# Patient Record
Sex: Male | Born: 1975 | Race: Black or African American | Hispanic: No | Marital: Single | Smoking: Current every day smoker
Health system: Southern US, Community
[De-identification: ages and names within clinical notes are randomized; demographics above are authoritative.]

---

## 2012-01-10 ENCOUNTER — Encounter (HOSPITAL_COMMUNITY): Payer: Self-pay | Admitting: Emergency Medicine

## 2012-01-10 ENCOUNTER — Emergency Department (HOSPITAL_COMMUNITY): Payer: Self-pay

## 2012-01-10 ENCOUNTER — Emergency Department (HOSPITAL_COMMUNITY)
Admission: EM | Admit: 2012-01-10 | Discharge: 2012-01-10 | Disposition: A | Discharge status: Home / Self Care | Payer: Self-pay | Attending: Emergency Medicine | Admitting: Emergency Medicine

## 2012-01-10 DIAGNOSIS — R109 Unspecified abdominal pain: Secondary | ICD-10-CM | POA: Insufficient documentation

## 2012-01-10 DIAGNOSIS — F17200 Nicotine dependence, unspecified, uncomplicated: Secondary | ICD-10-CM | POA: Insufficient documentation

## 2012-01-10 LAB — BASIC METABOLIC PANEL
BUN: 11 mg/dL (ref 6–23)
CO2: 27 mEq/L (ref 19–32)
Calcium: 9.4 mg/dL (ref 8.4–10.5)
Chloride: 104 mEq/L (ref 96–112)
Creatinine, Ser: 1.01 mg/dL (ref 0.50–1.35)
GFR calc Af Amer: >90 mL/min (ref >90)
GFR calc non Af Amer: >90 mL/min (ref >90)
Glucose, Bld: 113 mg/dL — ABNORMAL HIGH (ref 70–99)
Potassium: 4.4 mEq/L (ref 3.5–5.1)
Sodium: 138 mEq/L (ref 135–145)

## 2012-01-10 LAB — CBC WITH DIFFERENTIAL/PLATELET
Basophils Absolute: 0 10*3/uL (ref 0.0–0.1)
Basophils Relative: 0 % (ref 0–1)
Eosinophils Absolute: 0 10*3/uL (ref 0.0–0.7)
Eosinophils Relative: 0 % (ref 0–5)
HCT: 42.4 % (ref 39.0–52.0)
Hemoglobin: 14.8 g/dL (ref 13.0–17.0)
Lymphocytes Relative: 21 % (ref 12–46)
Lymphs Abs: 1.9 10*3/uL (ref 0.7–4.0)
MCH: 32.1 pg (ref 26.0–34.0)
MCHC: 34.9 g/dL (ref 30.0–36.0)
MCV: 92 fL (ref 78.0–100.0)
Monocytes Absolute: 0.7 10*3/uL (ref 0.1–1.0)
Monocytes Relative: 7 % (ref 3–12)
Neutro Abs: 6.4 10*3/uL (ref 1.7–7.7)
Neutrophils Relative %: 72 % (ref 43–77)
Platelets: 253 10*3/uL (ref 150–400)
RBC: 4.61 MIL/uL (ref 4.22–5.81)
RDW: 13.7 % (ref 11.5–15.5)
WBC: 9 10*3/uL (ref 4.0–10.5)

## 2012-01-10 LAB — URINALYSIS, ROUTINE W REFLEX MICROSCOPIC
Bilirubin Urine: NEGATIVE
Glucose, UA: NEGATIVE mg/dL
Hgb urine dipstick: NEGATIVE
Ketones, ur: NEGATIVE mg/dL
Leukocytes, UA: NEGATIVE
Nitrite: NEGATIVE
Protein, ur: NEGATIVE mg/dL
Specific Gravity, Urine: 1.015 (ref 1.005–1.030)
Urobilinogen, UA: 0.2 mg/dL (ref 0.0–1.0)
pH: 7 (ref 5.0–8.0)

## 2012-01-10 LAB — LACTIC ACID, PLASMA: Lactic Acid, Venous: 0.6 mmol/L (ref 0.5–2.2)

## 2012-01-10 MED ORDER — IOHEXOL 300 MG/ML  SOLN
100.0000 mL | Freq: Once | INTRAMUSCULAR | Status: AC | PRN
  Administered 2012-01-10: 100 mL via INTRAVENOUS

## 2012-01-10 MED ORDER — HYDROMORPHONE HCL PF 1 MG/ML IJ SOLN
1.0000 mg | Freq: Once | INTRAMUSCULAR | Status: AC
  Administered 2012-01-10: 1 mg via INTRAVENOUS
  Filled 2012-01-10: qty 1

## 2012-01-10 MED ORDER — SODIUM CHLORIDE 0.9 % IV SOLN
Freq: Once | INTRAVENOUS | Status: AC
  Administered 2012-01-10: 10:00:00 via INTRAVENOUS

## 2012-01-10 MED ORDER — HYDROCODONE-ACETAMINOPHEN 5-325 MG PO TABS: 1.0000 | ORAL_TABLET | Freq: Four times a day (QID) | ORAL | Status: DC | PRN

## 2012-01-10 MED ORDER — SODIUM CHLORIDE 0.9 % IV BOLUS (SEPSIS)
1000.0000 mL | Freq: Once | INTRAVENOUS | Status: AC
  Administered 2012-01-10: 1000 mL via INTRAVENOUS

## 2012-01-10 NOTE — ED Notes (Signed)
Pt states he woke up at 0200 with cramping pain to lower abdomen. Denies N/V/D. PT states last normal BM was yesterday morning.

## 2012-01-10 NOTE — ED Notes (Signed)
Pt complaining of severe lower abd pain since waking him up at 2am. Denies others symptoms.
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
## 2012-01-11 ENCOUNTER — Emergency Department (HOSPITAL_COMMUNITY)
Admission: EM | Admit: 2012-01-11 | Discharge: 2012-01-11 | Disposition: A | Discharge status: Home / Self Care | Payer: Self-pay | Attending: Emergency Medicine | Admitting: Emergency Medicine

## 2012-01-11 ENCOUNTER — Encounter (HOSPITAL_COMMUNITY): Payer: Self-pay | Admitting: *Deleted

## 2012-01-11 ENCOUNTER — Encounter (HOSPITAL_COMMUNITY): Payer: Self-pay | Admitting: Emergency Medicine

## 2012-01-11 DIAGNOSIS — R109 Unspecified abdominal pain: Secondary | ICD-10-CM

## 2012-01-11 DIAGNOSIS — F17200 Nicotine dependence, unspecified, uncomplicated: Secondary | ICD-10-CM | POA: Insufficient documentation

## 2012-01-11 LAB — CBC WITH DIFFERENTIAL/PLATELET
Basophils Absolute: 0 10*3/uL (ref 0.0–0.1)
Basophils Relative: 0 % (ref 0–1)
Eosinophils Absolute: 0 10*3/uL (ref 0.0–0.7)
Eosinophils Relative: 0 % (ref 0–5)
HCT: 42.2 % (ref 39.0–52.0)
Hemoglobin: 14.7 g/dL (ref 13.0–17.0)
Lymphocytes Relative: 19 % (ref 12–46)
Lymphs Abs: 2.1 10*3/uL (ref 0.7–4.0)
MCH: 32.7 pg (ref 26.0–34.0)
MCHC: 34.8 g/dL (ref 30.0–36.0)
MCV: 94 fL (ref 78.0–100.0)
Monocytes Absolute: 0.9 10*3/uL (ref 0.1–1.0)
Monocytes Relative: 8 % (ref 3–12)
Neutro Abs: 7.8 10*3/uL — ABNORMAL HIGH (ref 1.7–7.7)
Neutrophils Relative %: 72 % (ref 43–77)
Platelets: 254 10*3/uL (ref 150–400)
RBC: 4.49 MIL/uL (ref 4.22–5.81)
RDW: 13.8 % (ref 11.5–15.5)
WBC: 10.8 10*3/uL — ABNORMAL HIGH (ref 4.0–10.5)

## 2012-01-11 MED ORDER — HYOSCYAMINE SULFATE 0.125 MG SL SUBL: 0.1250 mg | SUBLINGUAL_TABLET | SUBLINGUAL | Status: DC | PRN

## 2012-01-11 MED ORDER — ONDANSETRON HCL 4 MG/2ML IJ SOLN: 4.0000 mg | Freq: Once | INTRAMUSCULAR | Status: DC

## 2012-01-11 MED ORDER — SODIUM CHLORIDE 0.9 % IV SOLN
Freq: Once | INTRAVENOUS | Status: AC
  Administered 2012-01-11: 1000 mL via INTRAVENOUS

## 2012-01-11 MED ORDER — CIPROFLOXACIN HCL 500 MG PO TABS: 500.0000 mg | ORAL_TABLET | Freq: Two times a day (BID) | ORAL | Status: DC

## 2012-01-11 MED ORDER — ONDANSETRON HCL 4 MG/2ML IJ SOLN
4.0000 mg | Freq: Once | INTRAMUSCULAR | Status: AC
  Administered 2012-01-11: 4 mg via INTRAVENOUS
  Filled 2012-01-11: qty 2

## 2012-01-11 MED ORDER — ONDANSETRON HCL 4 MG PO TABS: 4.0000 mg | ORAL_TABLET | Freq: Four times a day (QID) | ORAL | Status: DC

## 2012-01-11 MED ORDER — HYDROMORPHONE HCL PF 1 MG/ML IJ SOLN
1.0000 mg | Freq: Once | INTRAMUSCULAR | Status: AC
  Administered 2012-01-11: 1 mg via INTRAVENOUS
  Filled 2012-01-11: qty 1

## 2012-01-11 NOTE — ED Notes (Signed)
Instructions, prescriptions and f/u information reviewed - verbalizes understanding. Alert, in no distress.   

## 2012-01-11 NOTE — ED Provider Notes (Signed)
History     CSN: [id number]  Arrival date & time 01/10/12  2352   First MD Initiated Contact with Patient 01/11/12 0133      Chief Complaint  Patient presents with  . Abdominal Pain    (Consider location/radiation/quality/duration/timing/severity/associated sxs/prior treatment) HPI Joseph Young is a 36 y.o. male who presents to the Emergency Department complaining of recurrent lower abdominal pain that began tonight after eating his evening meal. He was seen and evaluated in the ER earlier today for abdominal pain that had been present for >6 hours. BMET was normal. CT scan of abdomen was unremarkable. He denies fever, chills, vomiting, diarrhea. Last BM was normal. Pain is across the lower abdomen.There are no aggravating or alleviating measures.   History reviewed. No pertinent past medical history.  History reviewed. No pertinent past surgical history.  No family history on file.  History  Substance Use Topics  . Smoking status: Current Every Day Smoker    Types: Cigarettes  . Smokeless tobacco: Not on file  . Alcohol Use: No      Review of Systems  Constitutional: Negative for fever.       10 Systems reviewed and are negative for acute change except as noted in the HPI.  HENT: Negative for congestion.   Eyes: Negative for discharge and redness.  Respiratory: Negative for cough and shortness of breath.   Cardiovascular: Negative for chest pain.  Gastrointestinal: Positive for abdominal pain. Negative for vomiting.  Musculoskeletal: Negative for back pain.  Skin: Negative for rash.  Neurological: Negative for syncope, numbness and headaches.  Psychiatric/Behavioral:       No behavior change.    Allergies  Review of patient's allergies indicates no known allergies.  Home Medications   Current Outpatient Rx  Name Route Sig Dispense Refill  . CIPROFLOXACIN HCL 500 MG PO TABS Oral Take 1 tablet (500 mg total) by mouth 2 (two) times daily. 14 tablet 0  .  HYDROCODONE-ACETAMINOPHEN 5-325 MG PO TABS Oral Take 1 tablet by mouth every 6 (six) hours as needed for pain. 10 tablet 0  . ONDANSETRON HCL 4 MG PO TABS Oral Take 1 tablet (4 mg total) by mouth every 6 (six) hours. 12 tablet 0    BP 144/81  Pulse 57  Temp 98.1 F (36.7 C) (Oral)  Resp 18  Ht 6\' 1"  (1.854 m)  Wt 170 lb (77.111 kg)  BMI 22.43 kg/m2  SpO2 100%  Physical Exam  Nursing note and vitals reviewed. Constitutional: He appears well-developed and well-nourished.       Awake, alert, nontoxic appearance.  HENT:  Head: Atraumatic.  Eyes: Right eye exhibits no discharge. Left eye exhibits no discharge.  Neck: Neck supple.  Cardiovascular: Normal heart sounds.   Pulmonary/Chest: Effort normal and breath sounds normal. He exhibits no tenderness.  Abdominal: Soft. Bowel sounds are normal. He exhibits no mass. There is tenderness. There is no rebound and no guarding.       Mild tenderness to suprapubic area with palpation  Genitourinary:       No cva tenderness to percussion  Musculoskeletal: He exhibits no tenderness.       Baseline ROM, no obvious new focal weakness.  Neurological:       Mental status and motor strength appears baseline for patient and situation.  Skin: No rash noted.  Psychiatric: He has a normal mood and affect.    ED Course  Procedures (including critical care time) Results for orders placed during the hospital  encounter of 01/11/12  CBC WITH DIFFERENTIAL      Component Value Range   WBC 10.8 (*) 4.0 - 10.5 K/uL   RBC 4.49  4.22 - 5.81 MIL/uL   Hemoglobin 14.7  13.0 - 17.0 g/dL   HCT 40.9  81.1 - 91.4 %   MCV 94.0  78.0 - 100.0 fL   MCH 32.7  26.0 - 34.0 pg   MCHC 34.8  30.0 - 36.0 g/dL   RDW 78.2  95.6 - 21.3 %   Platelets 254  150 - 400 K/uL   Neutrophils Relative 72  43 - 77 %   Neutro Abs 7.8 (*) 1.7 - 7.7 K/uL   Lymphocytes Relative 19  12 - 46 %   Lymphs Abs 2.1  0.7 - 4.0 K/uL   Monocytes Relative 8  3 - 12 %   Monocytes Absolute 0.9   0.1 - 1.0 K/uL   Eosinophils Relative 0  0 - 5 %   Eosinophils Absolute 0.0  0.0 - 0.7 K/uL   Basophils Relative 0  0 - 1 %   Basophils Absolute 0.0  0.0 - 0.1 K/uL    Ct Abdomen Pelvis Wo Contrast  01/10/2012  *RADIOLOGY REPORT*  Clinical Data: Abdominal pain, suprapubic pain  CT ABDOMEN AND PELVIS WITHOUT CONTRAST  Technique:  Multidetector CT imaging of the abdomen and pelvis was performed following the standard protocol without intravenous contrast.  Comparison: None.  Findings: Sagittal images of the spine are unremarkable.  Lung bases are unremarkable.  Unenhanced liver is unremarkable.  No calcified gallstones are noted within gallbladder.  Unenhanced pancreas, spleen and adrenals are unremarkable.  Unenhanced kidneys are symmetrical in size.  No nephrolithiasis.  No calcified ureteral calculi are noted. No hydronephrosis or hydroureter.  No aortic aneurysm.  No small bowel obstruction.  No ascites or free air.  Moderate stool in proximal colon.  No pericecal inflammation.  Normal appendix is clearly visualized in axial image 56.  The urinary bladder is under distended.  No calcified calculi are noted within urinary bladder.  Prostate gland is unremarkable.  No pelvic ascites or adenopathy.  No inguinal adenopathy.  No destructive bony lesions are noted within pelvis.  IMPRESSION:  1.  No nephrolithiasis.  No hydronephrosis or hydroureter. 2.  No calcified ureteral calculi are noted. 3.  Normal appendix.   Original Report Authenticated By: Natasha Mead, M.D.    Ct Abdomen Pelvis W Contrast  01/10/2012  *RADIOLOGY REPORT*  Clinical Data: Persistent left lower quadrant pain  CT ABDOMEN AND PELVIS WITH CONTRAST  Technique:  Multidetector CT imaging of the abdomen and pelvis was performed following the standard protocol during bolus administration of intravenous contrast.  Contrast: OMNIPAQUE IOHEXOL 300 MG/ML  SOLN  Comparison: Prior unenhanced CT scan same date.  Findings: Sagittal images of the  spine are unremarkable.  Lung bases are unremarkable.  Oral contrast material is noted within stomach.  Liver, spleen, pancreas and adrenals are unremarkable. Tiny umbilical hernia containing fat without evidence of acute complication.  No aortic aneurysm.  Kidneys are symmetrical in size and enhancement.  Small accessory splenule is noted in the left upper abdomen measures 1.2 cm.  Kidneys are symmetrical in size and enhancement.  No focal renal mass.  No hydronephrosis or hydroureter.  No small bowel obstruction.  No ascites or free air.  No adenopathy.  Limited evaluation of the colon which is not opacified with contrast.  The sigmoid colon is collapsed.  Some gas noted  in distal sigmoid colon.  Some gas noted within rectum.  No pericecal inflammation.  Normal appendix is clearly visualized.  The urinary bladder is unremarkable.  Prostate gland seminal vesicles are unremarkable.  No destructive bony lesions are noted within pelvis.  IMPRESSION:  1.  No hydronephrosis or hydroureter. 2.  No small bowel obstruction. 3.  No pericecal inflammation.  Normal appendix. 4.  Limited assessment of the colon which is not opacified with contrast.  The sigmoid colon is empty collapsed.  Some gas noted in the distal sigmoid colon.  Some gas noted within rectum.   Original Report Authenticated By: Natasha Mead, M.D.      1. Abdominal pain       MDM  Patient with recurrent lower abdominal pain. Work up done earlier in the day included CT abdomen with/w/o contrast  UAand BMET which were unremarkable. Given IVF and analgesics. CBC unremarkable. Initiated antibiotic therapy for a possible early colitis.  Dx testing d/w pt and family.  Questions answered.  Verb understanding, agreeable to d/c home with outpt f/u. Pt feels improved after observation and/or treatment in ED.Pt stable in ED with no significant deterioration in condition.The patient appears reasonably screened and/or stabilized for discharge and I doubt any other  medical condition or other [facility] requiring further screening, evaluation, or treatment in the ED at this time prior to discharge.  MDM Reviewed: nursing note, vitals and previous chart Reviewed previous: labs and CT scan Interpretation: labs          Nicoletta Dress. Colon Branch, MD 01/11/12 1610

## 2012-01-11 NOTE — ED Notes (Signed)
Discharge instructions reviewed with pt, questions answered. Pt verbalized understanding.  

## 2012-01-11 NOTE — ED Notes (Signed)
Ongoing abdominal pain since last visit yesterday. Denies vomiting.

## 2012-01-11 NOTE — ED Notes (Signed)
Patient seen in ER for same earlier.  C/o lower abdominal pain; denies nausea, vomiting or diarrhea.

## 2012-01-11 NOTE — ED Provider Notes (Signed)
History   This chart was scribed for Benny Lennert, MD, by Frederik Pear. The patient was seen in room APA01/APA01 and the patient's care was started at 1619.    CSN: [id number]  Arrival date & time 01/11/12  1619   First MD Initiated Contact with Patient 01/11/12 1704      Chief Complaint  Patient presents with  . Abdominal Pain    (Consider location/radiation/quality/duration/timing/severity/associated sxs/prior treatment) HPI Comments: Joseph Young is a 36 y.o. male who presents to the Emergency Department complaining of moderate, constant abdominal pain that began 2 days ago. He reports no associated vomiting. He states that when it first began that he came to the ED, but they were unable to determine a cause of the pain.        No PCP.  Patient is a 36 y.o. male presenting with abdominal pain. The history is provided by the patient.  Abdominal Pain The primary symptoms of the illness include abdominal pain. The primary symptoms of the illness do not include fatigue, vomiting or diarrhea. The current episode started 2 days ago. The onset of the illness was sudden. The problem has not changed since onset. Associated with: nothing. Symptoms associated with the illness do not include chills, hematuria, frequency or back pain.    History reviewed. No pertinent past medical history.  History reviewed. No pertinent past surgical history.  No family history on file.  History  Substance Use Topics  . Smoking status: Current Every Day Smoker    Types: Cigarettes  . Smokeless tobacco: Not on file  . Alcohol Use: No      Review of Systems  Constitutional: Negative for chills and fatigue.  HENT: Negative for congestion, sinus pressure and ear discharge.   Eyes: Negative for discharge.  Respiratory: Negative for cough.   Cardiovascular: Negative for chest pain.  Gastrointestinal: Positive for abdominal pain. Negative for vomiting and diarrhea.  Genitourinary: Negative  for frequency and hematuria.  Musculoskeletal: Negative for back pain.  Skin: Negative for rash.  Neurological: Negative for seizures and headaches.  Hematological: Negative.   Psychiatric/Behavioral: Negative for hallucinations.    Allergies  Review of patient's allergies indicates no known allergies.  Home Medications   Current Outpatient Rx  Name Route Sig Dispense Refill  . CIPROFLOXACIN HCL 500 MG PO TABS Oral Take 1 tablet (500 mg total) by mouth 2 (two) times daily. 14 tablet 0  . HYDROCODONE-ACETAMINOPHEN 5-325 MG PO TABS Oral Take 1 tablet by mouth every 6 (six) hours as needed for pain. 10 tablet 0  . ONDANSETRON HCL 4 MG PO TABS Oral Take 1 tablet (4 mg total) by mouth every 6 (six) hours. 12 tablet 0    There were no vitals taken for this visit.  Physical Exam  Constitutional: He is oriented to person, place, and time. He appears well-developed.  HENT:  Head: Normocephalic and atraumatic.  Eyes: Conjunctivae normal and EOM are normal. No scleral icterus.  Neck: Neck supple. No thyromegaly present.  Cardiovascular: Normal rate and regular rhythm.  Exam reveals no gallop and no friction rub.   No murmur heard. Pulmonary/Chest: No stridor. He has no wheezes. He has no rales. He exhibits no tenderness.  Abdominal: He exhibits no distension. There is tenderness. There is no rebound.       He has mild suprapubic tenderness.  Musculoskeletal: Normal range of motion. He exhibits no edema.  Lymphadenopathy:    He has no cervical adenopathy.  Neurological: He is oriented  to person, place, and time. Coordination normal.  Skin: No rash noted. No erythema.  Psychiatric: He has a normal mood and affect. His behavior is normal.    ED Course  Procedures (including critical care time)  DIAGNOSTIC STUDIES: Oxygen Saturation is 100% on room air, normal by my interpretation.    COORDINATION OF CARE:  17:13-- Discussed planned course of treatment with the patient, including  following up with a specialist,  who is agreeable at this time.    Labs Reviewed - No data to display Ct Abdomen Pelvis Wo Contrast  01/10/2012  *RADIOLOGY REPORT*  Clinical Data: Abdominal pain, suprapubic pain  CT ABDOMEN AND PELVIS WITHOUT CONTRAST  Technique:  Multidetector CT imaging of the abdomen and pelvis was performed following the standard protocol without intravenous contrast.  Comparison: None.  Findings: Sagittal images of the spine are unremarkable.  Lung bases are unremarkable.  Unenhanced liver is unremarkable.  No calcified gallstones are noted within gallbladder.  Unenhanced pancreas, spleen and adrenals are unremarkable.  Unenhanced kidneys are symmetrical in size.  No nephrolithiasis.  No calcified ureteral calculi are noted. No hydronephrosis or hydroureter.  No aortic aneurysm.  No small bowel obstruction.  No ascites or free air.  Moderate stool in proximal colon.  No pericecal inflammation.  Normal appendix is clearly visualized in axial image 56.  The urinary bladder is under distended.  No calcified calculi are noted within urinary bladder.  Prostate gland is unremarkable.  No pelvic ascites or adenopathy.  No inguinal adenopathy.  No destructive bony lesions are noted within pelvis.  IMPRESSION:  1.  No nephrolithiasis.  No hydronephrosis or hydroureter. 2.  No calcified ureteral calculi are noted. 3.  Normal appendix.   Original Report Authenticated By: Natasha Mead, M.D.    Ct Abdomen Pelvis W Contrast  01/10/2012  *RADIOLOGY REPORT*  Clinical Data: Persistent left lower quadrant pain  CT ABDOMEN AND PELVIS WITH CONTRAST  Technique:  Multidetector CT imaging of the abdomen and pelvis was performed following the standard protocol during bolus administration of intravenous contrast.  Contrast: OMNIPAQUE IOHEXOL 300 MG/ML  SOLN  Comparison: Prior unenhanced CT scan same date.  Findings: Sagittal images of the spine are unremarkable.  Lung bases are unremarkable.  Oral  contrast material is noted within stomach.  Liver, spleen, pancreas and adrenals are unremarkable. Tiny umbilical hernia containing fat without evidence of acute complication.  No aortic aneurysm.  Kidneys are symmetrical in size and enhancement.  Small accessory splenule is noted in the left upper abdomen measures 1.2 cm.  Kidneys are symmetrical in size and enhancement.  No focal renal mass.  No hydronephrosis or hydroureter.  No small bowel obstruction.  No ascites or free air.  No adenopathy.  Limited evaluation of the colon which is not opacified with contrast.  The sigmoid colon is collapsed.  Some gas noted in distal sigmoid colon.  Some gas noted within rectum.  No pericecal inflammation.  Normal appendix is clearly visualized.  The urinary bladder is unremarkable.  Prostate gland seminal vesicles are unremarkable.  No destructive bony lesions are noted within pelvis.  IMPRESSION:  1.  No hydronephrosis or hydroureter. 2.  No small bowel obstruction. 3.  No pericecal inflammation.  Normal appendix. 4.  Limited assessment of the colon which is not opacified with contrast.  The sigmoid colon is empty collapsed.  Some gas noted in the distal sigmoid colon.  Some gas noted within rectum.   Original Report Authenticated By: Lang Snow POP,  M.D.      No diagnosis found.    MDM    The chart was scribed for me under my direct supervision.  I personally performed the history, physical, and medical decision making and all procedures in the evaluation of this patient.Benny Lennert, MD 01/11/12 [phone]

## 2013-10-31 ENCOUNTER — Emergency Department (HOSPITAL_COMMUNITY): Payer: No Typology Code available for payment source

## 2013-10-31 ENCOUNTER — Emergency Department (HOSPITAL_COMMUNITY): Payer: Self-pay

## 2013-10-31 ENCOUNTER — Encounter (HOSPITAL_COMMUNITY): Payer: Self-pay | Admitting: Emergency Medicine

## 2013-10-31 ENCOUNTER — Emergency Department (HOSPITAL_COMMUNITY)
Admission: EM | Admit: 2013-10-31 | Discharge: 2013-10-31 | Disposition: A | Discharge status: Home / Self Care | Payer: Self-pay | Attending: Emergency Medicine | Admitting: Emergency Medicine

## 2013-10-31 DIAGNOSIS — S060X1A Concussion with loss of consciousness of 30 minutes or less, initial encounter: Secondary | ICD-10-CM | POA: Insufficient documentation

## 2013-10-31 DIAGNOSIS — S0990XA Unspecified injury of head, initial encounter: Secondary | ICD-10-CM | POA: Insufficient documentation

## 2013-10-31 DIAGNOSIS — S6392XA Sprain of unspecified part of left wrist and hand, initial encounter: Secondary | ICD-10-CM

## 2013-10-31 DIAGNOSIS — S51011A Laceration without foreign body of right elbow, initial encounter: Secondary | ICD-10-CM

## 2013-10-31 DIAGNOSIS — S51009A Unspecified open wound of unspecified elbow, initial encounter: Secondary | ICD-10-CM | POA: Insufficient documentation

## 2013-10-31 DIAGNOSIS — S6390XA Sprain of unspecified part of unspecified wrist and hand, initial encounter: Secondary | ICD-10-CM | POA: Insufficient documentation

## 2013-10-31 DIAGNOSIS — Y9389 Activity, other specified: Secondary | ICD-10-CM | POA: Insufficient documentation

## 2013-10-31 DIAGNOSIS — S5011XA Contusion of right forearm, initial encounter: Secondary | ICD-10-CM

## 2013-10-31 DIAGNOSIS — Y92410 Unspecified street and highway as the place of occurrence of the external cause: Secondary | ICD-10-CM | POA: Insufficient documentation

## 2013-10-31 DIAGNOSIS — F17200 Nicotine dependence, unspecified, uncomplicated: Secondary | ICD-10-CM | POA: Insufficient documentation

## 2013-10-31 DIAGNOSIS — S5000XA Contusion of unspecified elbow, initial encounter: Secondary | ICD-10-CM | POA: Insufficient documentation

## 2013-10-31 MED ORDER — SODIUM CHLORIDE 0.9 % IV BOLUS (SEPSIS)
1000.0000 mL | Freq: Once | INTRAVENOUS | Status: AC
  Administered 2013-10-31: 1000 mL via INTRAVENOUS

## 2013-10-31 MED ORDER — CEPHALEXIN 500 MG PO CAPS: 500.0000 mg | ORAL_CAPSULE | Freq: Four times a day (QID) | ORAL | Status: DC

## 2013-10-31 MED ORDER — MORPHINE SULFATE 4 MG/ML IJ SOLN
4.0000 mg | Freq: Once | INTRAMUSCULAR | Status: AC
  Administered 2013-10-31: 4 mg via INTRAVENOUS
  Filled 2013-10-31: qty 1

## 2013-10-31 MED ORDER — CEFAZOLIN SODIUM 1-5 GM-% IV SOLN
1.0000 g | Freq: Once | INTRAVENOUS | Status: AC
  Administered 2013-10-31: 1 g via INTRAVENOUS
  Filled 2013-10-31: qty 50

## 2013-10-31 MED ORDER — LIDOCAINE-EPINEPHRINE (PF) 1 %-1:200000 IJ SOLN
INTRAMUSCULAR | Status: AC
  Administered 2013-10-31: 08:00:00
  Filled 2013-10-31: qty 10

## 2013-10-31 MED ORDER — OXYCODONE-ACETAMINOPHEN 5-325 MG PO TABS: 1.0000 | ORAL_TABLET | ORAL | Status: DC | PRN

## 2013-10-31 MED ORDER — ONDANSETRON HCL 4 MG/2ML IJ SOLN
4.0000 mg | Freq: Once | INTRAMUSCULAR | Status: AC
  Administered 2013-10-31: 4 mg via INTRAVENOUS
  Filled 2013-10-31: qty 2

## 2013-10-31 MED ORDER — BACITRACIN ZINC 500 UNIT/GM EX OINT
TOPICAL_OINTMENT | CUTANEOUS | Status: AC
  Administered 2013-10-31: 08:00:00
  Filled 2013-10-31: qty 2.7

## 2013-10-31 MED ORDER — TETANUS-DIPHTH-ACELL PERTUSSIS 5-2.5-18.5 LF-MCG/0.5 IM SUSP
0.5000 mL | Freq: Once | INTRAMUSCULAR | Status: AC
  Administered 2013-10-31: 0.5 mL via INTRAMUSCULAR
  Filled 2013-10-31: qty 0.5

## 2013-10-31 NOTE — Discharge Instructions (Signed)

## 2013-10-31 NOTE — ED Notes (Signed)
0600 bp lying
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
## 2013-10-31 NOTE — ED Notes (Signed)
0612 BP standing, pt denies dizziness, wants to attempt walking.
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
## 2013-10-31 NOTE — ED Notes (Signed)
Arm splint removed for dr Aline Brochure.

## 2013-10-31 NOTE — ED Notes (Signed)
Splinting material hardened, pt denies feeling of cold or numbness in rt hand below splint. CMA intact on rt hand, sling applied for support of rt arm.
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
## 2013-10-31 NOTE — ED Notes (Signed)
9833 BP sitting

## 2013-10-31 NOTE — ED Notes (Signed)
Pt alert & oriented x4, stable gait. Patient given discharge instructions, paperwork & prescription(s). Patient  instructed to stop at the registration desk to finish any additional paperwork. Patient verbalized understanding. Pt left department w/ no further questions. 

## 2013-10-31 NOTE — ED Notes (Signed)
Back to room, pt tolerated walk well, still c/o pain right elbow

## 2013-10-31 NOTE — ED Notes (Signed)
Dr Harrison at bedside

## 2013-10-31 NOTE — ED Notes (Signed)
Pt cleansed head to toe. Abrasions noted forehead, knuckles left hand, small superficial abrasion/bruise to right hip, linear abrasion inner lower right arm. Previously noted laceration on elbow bubbled when washed. Cleansed and dressed lightly with sterile gauze Right elbow swollen from mid upper arm to mid lower arm. No injuries noted from waist down
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
## 2013-10-31 NOTE — ED Notes (Signed)
Per Dr. Christy Gentles after consult with Dr. Aline Brochure, will remove stitches and apply dressing with long arm posterior splint. Stitches removed by EDP.

## 2013-11-03 ENCOUNTER — Encounter: Payer: Self-pay | Admitting: Orthopedic Surgery

## 2013-11-03 ENCOUNTER — Ambulatory Visit (INDEPENDENT_AMBULATORY_CARE_PROVIDER_SITE_OTHER): Payer: Self-pay | Admitting: Orthopedic Surgery

## 2013-11-03 VITALS — BP 131/81 | Ht 73.0 in | Wt 170.0 lb

## 2013-11-03 DIAGNOSIS — S5002XD Contusion of left elbow, subsequent encounter: Secondary | ICD-10-CM

## 2013-11-03 DIAGNOSIS — Z5189 Encounter for other specified aftercare: Secondary | ICD-10-CM

## 2013-11-03 DIAGNOSIS — S5000XA Contusion of unspecified elbow, initial encounter: Secondary | ICD-10-CM | POA: Insufficient documentation

## 2013-11-03 MED ORDER — OXYCODONE-ACETAMINOPHEN 5-325 MG PO TABS: 1.0000 | ORAL_TABLET | ORAL | Status: DC | PRN

## 2013-11-03 MED ORDER — IBUPROFEN 800 MG PO TABS: 800.0000 mg | ORAL_TABLET | Freq: Three times a day (TID) | ORAL | Status: DC
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
## 2013-11-10 ENCOUNTER — Encounter: Payer: Self-pay | Admitting: Orthopedic Surgery

## 2013-11-10 ENCOUNTER — Ambulatory Visit (INDEPENDENT_AMBULATORY_CARE_PROVIDER_SITE_OTHER): Payer: Self-pay | Admitting: Orthopedic Surgery

## 2013-11-10 VITALS — BP 123/78 | Ht 73.0 in | Wt 170.0 lb

## 2013-11-10 DIAGNOSIS — S63602D Unspecified sprain of left thumb, subsequent encounter: Secondary | ICD-10-CM

## 2013-11-10 DIAGNOSIS — S139XXA Sprain of joints and ligaments of unspecified parts of neck, initial encounter: Secondary | ICD-10-CM

## 2013-11-10 DIAGNOSIS — S139XXD Sprain of joints and ligaments of unspecified parts of neck, subsequent encounter: Secondary | ICD-10-CM

## 2013-11-10 DIAGNOSIS — Z5189 Encounter for other specified aftercare: Secondary | ICD-10-CM

## 2013-11-10 DIAGNOSIS — S63609A Unspecified sprain of unspecified thumb, initial encounter: Secondary | ICD-10-CM | POA: Insufficient documentation

## 2013-11-10 DIAGNOSIS — S5000XA Contusion of unspecified elbow, initial encounter: Secondary | ICD-10-CM

## 2013-11-10 DIAGNOSIS — S5002XD Contusion of left elbow, subsequent encounter: Secondary | ICD-10-CM

## 2013-11-10 DIAGNOSIS — S6390XA Sprain of unspecified part of unspecified wrist and hand, initial encounter: Secondary | ICD-10-CM

## 2013-11-10 MED ORDER — OXYCODONE-ACETAMINOPHEN 5-325 MG PO TABS: 1.0000 | ORAL_TABLET | ORAL | Status: DC | PRN

## 2013-11-10 NOTE — Progress Notes (Signed)
Chief Complaint  Patient presents with  . Follow-up    1 week recheck on right elbow to check wound. DOI 10-31-13.    Recheck right elbow complains of numbness in the left thumb pain over the left metacarpophalangeal joint and pain with rotation of the cervical spine with some radicular symptoms  The right elbow is improving he has some pain at terminal extension his active range of motion limited extension 10 passive range of motion 0 full extension with pain full flexion. Collateral ligaments of the elbow are stable  Spurling sign neck negative. No tenderness cervical spine. Reflexes normal. Tenderness metacarpophalangeal joint.  Laceration and contusion right elbow Cervical sprain Left thumb is sprain metacarpal joint  Meds ordered this encounter  Medications  . oxyCODONE-acetaminophen (PERCOCET/ROXICET) 5-325 MG per tablet    Sig: Take 1 tablet by mouth every 4 (four) hours as needed for severe pain.    Dispense:  40 tablet    Refill:  0

## 2013-11-10 NOTE — Patient Instructions (Signed)
CHANGE DRESSING DAILY/ RIGHT ELBOW  Meds ordered this encounter  Medications  . oxyCODONE-acetaminophen (PERCOCET/ROXICET) 5-325 MG per tablet    Sig: Take 1 tablet by mouth every 4 (four) hours as needed for severe pain.    Dispense:  40 tablet    Refill:  0

## 2013-11-18 ENCOUNTER — Encounter: Payer: Self-pay | Admitting: Orthopedic Surgery

## 2013-11-18 ENCOUNTER — Ambulatory Visit (INDEPENDENT_AMBULATORY_CARE_PROVIDER_SITE_OTHER): Payer: Self-pay | Admitting: Orthopedic Surgery

## 2013-11-18 VITALS — BP 131/82 | Ht 73.0 in | Wt 170.0 lb

## 2013-11-18 DIAGNOSIS — S5002XD Contusion of left elbow, subsequent encounter: Secondary | ICD-10-CM

## 2013-11-18 DIAGNOSIS — S5000XA Contusion of unspecified elbow, initial encounter: Secondary | ICD-10-CM

## 2013-11-18 DIAGNOSIS — Z5189 Encounter for other specified aftercare: Secondary | ICD-10-CM

## 2013-11-18 MED ORDER — IBUPROFEN 800 MG PO TABS: 800.0000 mg | ORAL_TABLET | Freq: Three times a day (TID) | ORAL | Status: DC

## 2013-11-18 NOTE — Progress Notes (Signed)
Chief Complaint  Patient presents with  . Follow-up    follow up right elbow, DOI 10/31/13    Elbow contusion resolved once resolved patient has a little soreness in the elbow but full range of motion.  Review of systems he has a trigger point in the left shoulder causing his left thumb numbness which is resolved by massaging the scapular stabilizers  BP 131/82  Ht 6\' 1"  (1.854 m)  Wt 170 lb (77.111 kg)  BMI 22.43 kg/m2 General appearance is normal, the patient is alert and oriented x3 with normal mood and affect.   Again left elbow no swelling wounds healed. Full range of motion ligaments are stable strength is normal in flexion extension and pulses noted and normal sensation.  Elbow contusion right  Return to work  Follow as needed ibuprofen as needed for pain

## 2013-11-18 NOTE — Patient Instructions (Signed)
Return to work note

## 2013-11-27 ENCOUNTER — Telehealth: Payer: Self-pay | Admitting: Orthopedic Surgery

## 2013-11-27 NOTE — Telephone Encounter (Signed)
Patient called just before lunch today, 11/27/13, to relay that he had a fall, and since then, said his right elbow has started hurting again. He said the elbow wound is healed--and this was not affected; said Ibuprofen is not helping much, and is asking for a new appointment, although "self-pay" which he said is hard.  Please advise if he is to schedule appointment for new injury or if there are other recommendations.  His ph# [phone].

## 2013-11-27 NOTE — Telephone Encounter (Signed)
Routing to Dr Harrison 

## 2013-11-28 NOTE — Telephone Encounter (Signed)
YES

## 2013-12-01 NOTE — Telephone Encounter (Signed)
Yes please schedule. 

## 2013-12-02 NOTE — Telephone Encounter (Signed)
Called back to patient, offered appointment and scheduled. °

## 2013-12-08 ENCOUNTER — Ambulatory Visit (INDEPENDENT_AMBULATORY_CARE_PROVIDER_SITE_OTHER): Payer: Self-pay

## 2013-12-08 ENCOUNTER — Encounter: Payer: Self-pay | Admitting: Orthopedic Surgery

## 2013-12-08 ENCOUNTER — Ambulatory Visit (INDEPENDENT_AMBULATORY_CARE_PROVIDER_SITE_OTHER): Payer: Self-pay | Admitting: Orthopedic Surgery

## 2013-12-08 VITALS — BP 126/81 | Ht 73.0 in | Wt 170.0 lb

## 2013-12-08 DIAGNOSIS — M25529 Pain in unspecified elbow: Secondary | ICD-10-CM

## 2013-12-08 DIAGNOSIS — M25521 Pain in right elbow: Secondary | ICD-10-CM

## 2013-12-08 DIAGNOSIS — S5000XA Contusion of unspecified elbow, initial encounter: Secondary | ICD-10-CM

## 2013-12-08 DIAGNOSIS — S5001XA Contusion of right elbow, initial encounter: Secondary | ICD-10-CM

## 2013-12-08 MED ORDER — HYDROCODONE-ACETAMINOPHEN 5-325 MG PO TABS: 1.0000 | ORAL_TABLET | ORAL | Status: DC | PRN

## 2013-12-08 NOTE — Patient Instructions (Signed)
We do not find a fracture and have diagnosed her with a contusion of the elbow.  Take ibuprofen over-the-counter as needed for pain to 3 tablets every 8 hours. Use ice and/or heat as needed to control swelling and pain respectively  Followup as needed
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
## 2014-05-15 ENCOUNTER — Emergency Department (HOSPITAL_COMMUNITY)
Admission: EM | Admit: 2014-05-15 | Discharge: 2014-05-16 | Disposition: A | Discharge status: Home / Self Care | Payer: Self-pay | Attending: Emergency Medicine | Admitting: Emergency Medicine

## 2014-05-15 ENCOUNTER — Emergency Department (HOSPITAL_COMMUNITY): Payer: Self-pay

## 2014-05-15 ENCOUNTER — Encounter (HOSPITAL_COMMUNITY): Payer: Self-pay | Admitting: Emergency Medicine

## 2014-05-15 DIAGNOSIS — Z79899 Other long term (current) drug therapy: Secondary | ICD-10-CM | POA: Insufficient documentation

## 2014-05-15 DIAGNOSIS — R630 Anorexia: Secondary | ICD-10-CM | POA: Insufficient documentation

## 2014-05-15 DIAGNOSIS — Z792 Long term (current) use of antibiotics: Secondary | ICD-10-CM | POA: Insufficient documentation

## 2014-05-15 DIAGNOSIS — Z720 Tobacco use: Secondary | ICD-10-CM | POA: Insufficient documentation

## 2014-05-15 DIAGNOSIS — R69 Illness, unspecified: Secondary | ICD-10-CM

## 2014-05-15 DIAGNOSIS — R42 Dizziness and giddiness: Secondary | ICD-10-CM | POA: Insufficient documentation

## 2014-05-15 DIAGNOSIS — J111 Influenza due to unidentified influenza virus with other respiratory manifestations: Secondary | ICD-10-CM | POA: Insufficient documentation

## 2014-05-15 NOTE — ED Notes (Signed)
Onset 2 day, nausea, joint pain, dizziness, diarrhea, family meets have been sick and treated
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
## 2014-05-16 MED ORDER — OSELTAMIVIR PHOSPHATE 75 MG PO CAPS
75.0000 mg | ORAL_CAPSULE | Freq: Every day | ORAL | Status: AC
Start: 2014-05-16 — End: 2014-05-24

## 2014-05-16 MED ORDER — OSELTAMIVIR PHOSPHATE 75 MG PO CAPS
75.0000 mg | ORAL_CAPSULE | Freq: Once | ORAL | Status: AC
  Administered 2014-05-16: 75 mg via ORAL
  Filled 2014-05-16: qty 1

## 2014-05-16 NOTE — Discharge Instructions (Signed)
As discussed, you have been diagnosed with an influenza-like illness.  Please take all medication as directed, and do not hesitate to return here. Please be sure to follow-up with your physician to ensure appropriate ongoing care.

## 2016-10-04 ENCOUNTER — Emergency Department (HOSPITAL_COMMUNITY)
Admission: EM | Admit: 2016-10-04 | Discharge: 2016-10-04 | Disposition: A | Discharge status: Home / Self Care | Payer: Self-pay | Attending: Emergency Medicine | Admitting: Emergency Medicine

## 2016-10-04 ENCOUNTER — Emergency Department (HOSPITAL_COMMUNITY): Payer: Self-pay

## 2016-10-04 ENCOUNTER — Encounter (HOSPITAL_COMMUNITY): Payer: Self-pay | Admitting: Emergency Medicine

## 2016-10-04 DIAGNOSIS — R0789 Other chest pain: Secondary | ICD-10-CM | POA: Insufficient documentation

## 2016-10-04 DIAGNOSIS — Z79899 Other long term (current) drug therapy: Secondary | ICD-10-CM | POA: Insufficient documentation

## 2016-10-04 DIAGNOSIS — F17210 Nicotine dependence, cigarettes, uncomplicated: Secondary | ICD-10-CM | POA: Insufficient documentation

## 2016-10-04 LAB — COMPREHENSIVE METABOLIC PANEL
ALT: 53 U/L (ref 17–63)
AST: 36 U/L (ref 15–41)
Albumin: 4.3 g/dL (ref 3.5–5.0)
Alkaline Phosphatase: 64 U/L (ref 38–126)
Anion gap: 9 (ref 5–15)
BUN: 13 mg/dL (ref 6–20)
CO2: 23 mmol/L (ref 22–32)
Calcium: 9.1 mg/dL (ref 8.9–10.3)
Chloride: 101 mmol/L (ref 101–111)
Creatinine, Ser: 1.32 mg/dL — ABNORMAL HIGH (ref 0.61–1.24)
GFR calc Af Amer: >60 mL/min (ref >60)
GFR calc non Af Amer: >60 mL/min (ref >60)
Glucose, Bld: 102 mg/dL — ABNORMAL HIGH (ref 65–99)
Potassium: 4 mmol/L (ref 3.5–5.1)
Sodium: 133 mmol/L — ABNORMAL LOW (ref 135–145)
Total Bilirubin: 0.7 mg/dL (ref 0.3–1.2)
Total Protein: 8 g/dL (ref 6.5–8.1)

## 2016-10-04 LAB — CBC WITH DIFFERENTIAL/PLATELET
Basophils Absolute: 0 10*3/uL (ref 0.0–0.1)
Basophils Relative: 0 %
Eosinophils Absolute: 0 10*3/uL (ref 0.0–0.7)
Eosinophils Relative: 0 %
HCT: 44.7 % (ref 39.0–52.0)
Hemoglobin: 15.3 g/dL (ref 13.0–17.0)
Lymphocytes Relative: 19 %
Lymphs Abs: 1.3 10*3/uL (ref 0.7–4.0)
MCH: 32.6 pg (ref 26.0–34.0)
MCHC: 34.2 g/dL (ref 30.0–36.0)
MCV: 95.1 fL (ref 78.0–100.0)
Monocytes Absolute: 1.5 10*3/uL — ABNORMAL HIGH (ref 0.1–1.0)
Monocytes Relative: 22 %
Neutro Abs: 4.1 10*3/uL (ref 1.7–7.7)
Neutrophils Relative %: 59 %
Platelets: 243 10*3/uL (ref 150–400)
RBC: 4.7 MIL/uL (ref 4.22–5.81)
RDW: 13.4 % (ref 11.5–15.5)
WBC: 6.9 10*3/uL (ref 4.0–10.5)

## 2016-10-04 LAB — TROPONIN I: Troponin I: <0.03 ng/mL (ref <0.03)

## 2016-10-04 LAB — LIPASE, BLOOD: Lipase: 21 U/L (ref 11–51)

## 2016-10-04 MED ORDER — ASPIRIN 325 MG PO TABS
325.0000 mg | ORAL_TABLET | Freq: Once | ORAL | Status: AC
  Administered 2016-10-04: 325 mg via ORAL
  Filled 2016-10-04: qty 1

## 2016-10-04 MED ORDER — MORPHINE SULFATE (PF) 4 MG/ML IV SOLN
4.0000 mg | Freq: Once | INTRAVENOUS | Status: AC
  Administered 2016-10-04: 4 mg via INTRAVENOUS
  Filled 2016-10-04: qty 1

## 2016-10-04 NOTE — ED Provider Notes (Signed)
[facility] DEPT Provider Note   CSN: [id number] Arrival date & time: 10/04/16  0609     History   Chief Complaint Chief Complaint  Patient presents with  . Chest Pain    HPI Joseph Young is a 41 y.o. male.  HPI  This is a 41 year old male who presents with chest pain. No significant past medical history. He is a current smoker. He reports chest pain since Thursday. He describes it as pressure and "something sitting on my chest." Nothing seems to make it better or worse. There is not an exertional component. He denies history of blood clots, lower leg swelling, recent long travel. He states that currently his pain is one out of 10 but it was much worse prior to arrival. Currently he has had chest pain ongoing for the last 8 hours. He denies any shortness of breath. He does endorse nausea. No abdominal pain. Reports generalized bodyaches. No cough or fevers. Denies early family history of heart disease.  History reviewed. No pertinent past medical history.  Patient Active Problem List   Diagnosis Date Noted  . Cervical sprain 11/10/2013  . Sprain, thumb 11/10/2013  . Elbow contusion 11/03/2013    History reviewed. No pertinent surgical history.     Home Medications    Prior to Admission medications   Medication Sig Start Date End Date Taking? Authorizing Provider  cephALEXin (KEFLEX) 500 MG capsule Take 1 capsule (500 mg total) by mouth 4 (four) times daily. 10/31/13   Ripley Fraise, MD  HYDROcodone-acetaminophen (NORCO/VICODIN) 5-325 MG per tablet Take 1 tablet by mouth every 4 (four) hours as needed for moderate pain. 12/08/13   Carole Civil, MD  ibuprofen (ADVIL,MOTRIN) 800 MG tablet Take 1 tablet (800 mg total) by mouth 3 (three) times daily. 11/18/13   Carole Civil, MD  oxyCODONE-acetaminophen (PERCOCET/ROXICET) 5-325 MG per tablet Take 1 tablet by mouth every 4 (four) hours as needed for severe pain. 11/10/13   Carole Civil, MD    Family  History No family history on file.  Social History Social History  Substance Use Topics  . Smoking status: Current Every Day Smoker    Packs/day: 0.25    Types: Cigarettes  . Smokeless tobacco: Never Used  . Alcohol use 3.6 oz/week    6 Cans of beer per week     Allergies   Patient has no known allergies.   Review of Systems Review of Systems  Constitutional: Negative for fever.  Respiratory: Negative for cough and shortness of breath.   Cardiovascular: Positive for chest pain. Negative for leg swelling.  Gastrointestinal: Positive for nausea. Negative for anal bleeding and vomiting.  Musculoskeletal: Positive for myalgias.  All other systems reviewed and are negative.    Physical Exam Updated Vital Signs BP (!) 99/59   Pulse (!) 58   Temp 99.6 F (37.6 C) (Oral)   Resp 20   Ht 6\' 1"  (1.854 m)   Wt 77.1 kg (170 lb)   SpO2 95%   BMI 22.43 kg/m   Physical Exam  Constitutional: He is oriented to person, place, and time. He appears well-developed and well-nourished. No distress.  HENT:  Head: Normocephalic and atraumatic.  Cardiovascular: Normal rate, regular rhythm and normal heart sounds.   No murmur heard. Pulmonary/Chest: Effort normal and breath sounds normal. No respiratory distress. He has no wheezes. He exhibits no tenderness.  Abdominal: Soft. There is no tenderness.  Musculoskeletal: He exhibits no edema.  Neurological: He is alert and  oriented to person, place, and time.  Skin: Skin is warm and dry.  Psychiatric: He has a normal mood and affect.  Nursing note and vitals reviewed.    ED Treatments / Results  Labs (all labs ordered are listed, but only abnormal results are displayed) Labs Reviewed  CBC WITH DIFFERENTIAL/PLATELET - Abnormal; Notable for the following:       Result Value   Monocytes Absolute 1.5 (*)    All other components within normal limits  COMPREHENSIVE METABOLIC PANEL - Abnormal; Notable for the following:    Sodium 133 (*)     Glucose, Bld 102 (*)    Creatinine, Ser 1.32 (*)    All other components within normal limits  LIPASE, BLOOD  TROPONIN I    EKG  EKG Interpretation  Date/Time:  Wednesday October 04 2016 06:14:50 EDT Ventricular Rate:  85 PR Interval:    QRS Duration: 82 QT Interval:  334 QTC Calculation: 398 R Axis:   42 Text Interpretation:  Sinus rhythm Borderline T wave abnormalities T wave inversions inferiorly and laterally No prior for comparison Confirmed by Thayer Jew [phone]) on 10/04/2016 6:24:46 AM       Radiology Dg Chest 2 View  Result Date: 10/04/2016 CLINICAL DATA:  Chest pain for 4 days.  Nausea. EXAM: CHEST  2 VIEW COMPARISON:  05/15/2014 FINDINGS: The lungs are clear. The pulmonary vasculature is normal. Heart size is normal. Hilar and mediastinal contours are unremarkable. There is no pleural effusion. IMPRESSION: No active cardiopulmonary disease. Electronically Signed   By: Andreas Newport M.D.   On: 10/04/2016 06:57    Procedures Procedures (including critical care time)  Medications Ordered in ED Medications  aspirin tablet 325 mg (325 mg Oral Given 10/04/16 0635)  morphine 4 MG/ML injection 4 mg (4 mg Intravenous Given 10/04/16 0644)     Initial Impression / Assessment and Plan / ED Course  I have reviewed the triage vital signs and the nursing notes.  Pertinent labs & imaging results that were available during my care of the patient were reviewed by me and considered in my medical decision making (see chart for details).     Patient presents with chest pain. Atypical in nature. Low-risk ACS with a heart score of 2. He does have some T-wave inversions inferiorly and laterally. No prior EKG for comparison. Initial workup is reassuring. He is PERC negative. Chest x-ray is negative. Initial troponin is negative. Given that he has had 8 hours of ongoing pain and has had pain since Thursday, feel this is adequate for ED workup. On recheck he feels much better  after receiving some pain medication. He remains nontoxic. Recommend cardiology follow-up.  After history, exam, and medical workup I feel the patient has been appropriately medically screened and is safe for discharge home. Pertinent diagnoses were discussed with the patient. Patient was given return precautions.   Final Clinical Impressions(s) / ED Diagnoses   Final diagnoses:  Atypical chest pain    New Prescriptions New Prescriptions   No medications on file     Merryl Hacker, MD 10/04/16 [phone]

## 2016-10-04 NOTE — Discharge Instructions (Signed)
You were seen today for chest pain. Your workup is reassuring. Continue to monitor your symptoms. Follow-up with cardiology as above. If you have any new or worsening symptoms you should be reevaluated.

## 2016-10-04 NOTE — ED Triage Notes (Signed)
Pt c/o chest pain since Thursday and states he is nauseated.

## 2017-02-23 ENCOUNTER — Emergency Department (HOSPITAL_COMMUNITY)
Admission: EM | Admit: 2017-02-23 | Discharge: 2017-02-23 | Disposition: A | Discharge status: Home / Self Care | Payer: Self-pay | Attending: Emergency Medicine | Admitting: Emergency Medicine

## 2017-02-23 ENCOUNTER — Encounter (HOSPITAL_COMMUNITY): Payer: Self-pay | Admitting: Emergency Medicine

## 2017-02-23 DIAGNOSIS — Y93H1 Activity, digging, shoveling and raking: Secondary | ICD-10-CM | POA: Insufficient documentation

## 2017-02-23 DIAGNOSIS — Y999 Unspecified external cause status: Secondary | ICD-10-CM | POA: Insufficient documentation

## 2017-02-23 DIAGNOSIS — S39012A Strain of muscle, fascia and tendon of lower back, initial encounter: Secondary | ICD-10-CM | POA: Insufficient documentation

## 2017-02-23 DIAGNOSIS — X500XXA Overexertion from strenuous movement or load, initial encounter: Secondary | ICD-10-CM | POA: Insufficient documentation

## 2017-02-23 DIAGNOSIS — Y92018 Other place in single-family (private) house as the place of occurrence of the external cause: Secondary | ICD-10-CM | POA: Insufficient documentation

## 2017-02-23 DIAGNOSIS — F17210 Nicotine dependence, cigarettes, uncomplicated: Secondary | ICD-10-CM | POA: Insufficient documentation

## 2017-02-23 MED ORDER — KETOROLAC TROMETHAMINE 60 MG/2ML IM SOLN
60.0000 mg | Freq: Once | INTRAMUSCULAR | Status: AC
  Administered 2017-02-23: 60 mg via INTRAMUSCULAR
  Filled 2017-02-23: qty 2

## 2017-02-23 MED ORDER — IBUPROFEN 800 MG PO TABS: 800.0000 mg | ORAL_TABLET | Freq: Three times a day (TID) | ORAL | 0 refills | Status: DC

## 2017-02-23 MED ORDER — CYCLOBENZAPRINE HCL 10 MG PO TABS: 10.0000 mg | ORAL_TABLET | Freq: Three times a day (TID) | ORAL | 0 refills | Status: DC | PRN

## 2017-02-23 MED ORDER — OXYCODONE-ACETAMINOPHEN 5-325 MG PO TABS: 1.0000 | ORAL_TABLET | ORAL | 0 refills | Status: DC | PRN

## 2017-02-23 MED ORDER — DIAZEPAM 5 MG PO TABS
5.0000 mg | ORAL_TABLET | Freq: Once | ORAL | Status: AC
  Administered 2017-02-23: 5 mg via ORAL
  Filled 2017-02-23: qty 1

## 2017-02-23 NOTE — Discharge Instructions (Signed)
Apply ice packs on and off to your back for the next several days.  Avoid twisting bending or heavy lifting for 1 week.  Follow-up with your primary doctor or return here for any worsening symptoms.

## 2017-02-23 NOTE — ED Triage Notes (Addendum)
Patient states he shoveled snow yesterday and woke this morning with lower back pain. Denies chronic back pain. Denies dysuria.

## 2017-02-23 NOTE — ED Provider Notes (Signed)
[facility] EMERGENCY DEPARTMENT Provider Note   CSN: [id number] Arrival date & time: 02/23/17  3976     History   Chief Complaint Chief Complaint  Patient presents with  . Back Pain    HPI MATTIA LIFORD is a 41 y.o. male.  HPI   SKYELAR SWIGART is a 41 y.o. male who presents to the Emergency Department complaining of right-sided low back pain that began this morning.  He states that he was shoveling snow yesterday but did not feel pain at that time.  He describes a constant, throbbing pain that is focal to his right lower back.  Pain is exacerbated by movement.  He denies fall.  He has not tried any medications or therapies prior to ER arrival.  He states he does not have a history of previous back pain.  He denies abdominal pain, chest pain, numbness pain or weakness to his lower extremities, fever, chills, urine or bowel changes.  History reviewed. No pertinent past medical history.  Patient Active Problem List   Diagnosis Date Noted  . Cervical sprain 11/10/2013  . Sprain, thumb 11/10/2013  . Elbow contusion 11/03/2013    History reviewed. No pertinent surgical history.     Home Medications    Prior to Admission medications   Medication Sig Start Date End Date Taking? Authorizing Provider  cephALEXin (KEFLEX) 500 MG capsule Take 1 capsule (500 mg total) by mouth 4 (four) times daily. 10/31/13   Ripley Fraise, MD  HYDROcodone-acetaminophen (NORCO/VICODIN) 5-325 MG per tablet Take 1 tablet by mouth every 4 (four) hours as needed for moderate pain. 12/08/13   Carole Civil, MD  ibuprofen (ADVIL,MOTRIN) 800 MG tablet Take 1 tablet (800 mg total) by mouth 3 (three) times daily. 11/18/13   Carole Civil, MD  oxyCODONE-acetaminophen (PERCOCET/ROXICET) 5-325 MG per tablet Take 1 tablet by mouth every 4 (four) hours as needed for severe pain. 11/10/13   Carole Civil, MD    Family History History reviewed. No pertinent family history.  Social  History Social History   Tobacco Use  . Smoking status: Current Every Day Smoker    Packs/day: 0.25    Types: Cigarettes  . Smokeless tobacco: Never Used  Substance Use Topics  . Alcohol use: Yes    Alcohol/week: 3.6 oz    Types: 6 Cans of beer per week    Comment: occasionally  . Drug use: No     Allergies   Patient has no known allergies.   Review of Systems Review of Systems  Constitutional: Negative for fever.  Respiratory: Negative for shortness of breath.   Gastrointestinal: Negative for abdominal pain, constipation and vomiting.  Genitourinary: Negative for decreased urine volume, difficulty urinating, dysuria, flank pain and hematuria.  Musculoskeletal: Positive for back pain. Negative for joint swelling.  Skin: Negative for rash.  Neurological: Negative for weakness and numbness.  All other systems reviewed and are negative.    Physical Exam Updated Vital Signs BP (!) 143/95 (BP Location: Right Arm)   Pulse 76   Temp 98.4 F (36.9 C) (Oral)   Resp 16   Ht 6\' 1"  (1.854 m)   Wt 77.1 kg (170 lb)   SpO2 100%   BMI 22.43 kg/m   Physical Exam  Constitutional: He is oriented to person, place, and time. He appears well-developed and well-nourished.  Patient is uncomfortable appearing and tearful  HENT:  Head: Normocephalic and atraumatic.  Neck: Normal range of motion. Neck supple.  Cardiovascular: Normal  rate, regular rhythm and intact distal pulses.  No murmur heard. Pulmonary/Chest: Effort normal and breath sounds normal. No respiratory distress.  Abdominal: Soft. He exhibits no distension. There is no tenderness.  Musculoskeletal: He exhibits tenderness. He exhibits no edema.       Lumbar back: He exhibits tenderness and pain. He exhibits normal range of motion, no swelling, no deformity, no laceration and normal pulse.  Focal ttp of the right lumbar paraspinal muscles.  No spinal tenderness.  Pain reproduced on straight leg raise on the right. pt has  5/5 strength against resistance of bilateral lower extremities.     Neurological: He is alert and oriented to person, place, and time. He has normal strength. No sensory deficit. He exhibits normal muscle tone. Coordination and gait normal.  Reflex Scores:      Patellar reflexes are 2+ on the right side and 2+ on the left side.      Achilles reflexes are 2+ on the right side and 2+ on the left side. Skin: Skin is warm and dry. Capillary refill takes less than 2 seconds. No rash noted.  Nursing note and vitals reviewed.    ED Treatments / Results  Labs (all labs ordered are listed, but only abnormal results are displayed) Labs Reviewed - No data to display  EKG  EKG Interpretation None       Radiology No results found.  Procedures Procedures (including critical care time)  Medications Ordered in ED Medications  ketorolac (TORADOL) injection 60 mg (60 mg Intramuscular Given 02/23/17 0850)  diazepam (VALIUM) tablet 5 mg (5 mg Oral Given 02/23/17 0850)     Initial Impression / Assessment and Plan / ED Course  I have reviewed the triage vital signs and the nursing notes.  Pertinent labs & imaging results that were available during my care of the patient were reviewed by me and considered in my medical decision making (see chart for details).     Patient is nontoxic.  Ambulates with a slow but steady gait.  No focal neuro deficits on exam.  No concerning symptoms for emergent neurological process or infection.  Will try IM Toradol and Valium.  No history of trauma to suggest need for imaging at this time.  1010 on recheck, patient states that he is feeling better, but pain is not resolved.  He states that he is driving home and prefers not to have any additional stronger medication at this time.  He appears stable for discharge, his symptoms are likely musculoskeletal.  I have discussed return precautions.  He agrees to symptomatic treatment with rest, ice, nonsteroidal, pain  medication and muscle relaxer.  Final Clinical Impressions(s) / ED Diagnoses   Final diagnoses:  Strain of lumbar region, initial encounter    ED Discharge Orders    None       Kem Parkinson, PA-C 02/23/17 [place], MD 02/26/17 [phone]

## 2017-02-23 NOTE — ED Notes (Signed)
Advised patient not to drive while taking prescription pain medications. Patient verbalized understanding. Refused wheelchair at discharge, states "it feels better to walk."

## 2017-02-23 NOTE — ED Notes (Signed)
Patient states he does not want any stronger pain medication while in the ER due to "I need to get this guy his truck back." Patient is requesting stronger pain medication as a prescription to take home. Advised Tammy Triplett PA.

## 2017-02-23 NOTE — ED Notes (Signed)
Placed ice pack on patient's lower back as requested by Kem Parkinson PA.

## 2017-09-12 ENCOUNTER — Other Ambulatory Visit: Payer: Self-pay

## 2017-09-12 ENCOUNTER — Encounter (HOSPITAL_COMMUNITY): Payer: Self-pay | Admitting: Emergency Medicine

## 2017-09-12 ENCOUNTER — Emergency Department (HOSPITAL_COMMUNITY)
Admission: EM | Admit: 2017-09-12 | Discharge: 2017-09-12 | Disposition: A | Discharge status: Home / Self Care | Payer: Self-pay | Attending: Emergency Medicine | Admitting: Emergency Medicine

## 2017-09-12 DIAGNOSIS — M545 Low back pain, unspecified: Secondary | ICD-10-CM

## 2017-09-12 DIAGNOSIS — F17210 Nicotine dependence, cigarettes, uncomplicated: Secondary | ICD-10-CM | POA: Insufficient documentation

## 2017-09-12 MED ORDER — PREDNISONE 10 MG (21) PO TBPK: ORAL_TABLET | Freq: Every day | ORAL | 0 refills | Status: DC

## 2017-09-12 MED ORDER — HYDROCODONE-ACETAMINOPHEN 5-325 MG PO TABS
1.0000 | ORAL_TABLET | Freq: Once | ORAL | Status: AC
  Administered 2017-09-12: 1 via ORAL
  Filled 2017-09-12: qty 1

## 2017-09-12 MED ORDER — CYCLOBENZAPRINE HCL 10 MG PO TABS: 10.0000 mg | ORAL_TABLET | Freq: Two times a day (BID) | ORAL | 0 refills | Status: DC | PRN

## 2017-09-12 MED ORDER — CYCLOBENZAPRINE HCL 10 MG PO TABS
10.0000 mg | ORAL_TABLET | Freq: Once | ORAL | Status: AC
  Administered 2017-09-12: 10 mg via ORAL
  Filled 2017-09-12: qty 1

## 2017-09-12 NOTE — Discharge Instructions (Signed)
1. Medications: Take steroid pack as prescribed.  Do not take Advil, Aleve, Motrin, or ibuprofen while taking this medication.  You may take [phone] mg of Tylenol every 6 hours as needed for pain. Do not exceed 4000 mg of Tylenol daily.  Take steroid taper with food to avoid upset stomach issues.  You can take Flexeril as needed for muscle spasm up to twice daily but do not drive, drink alcohol, or operate heavy machinery while taking this medicine because it may make you drowsy.  I typically recommend taking this medicine only at night when you are going to sleep.  You can also cut these tablets in half if they make you feel very drowsy. 2. Treatment: rest, drink plenty of fluids, gentle stretching as discussed (see attached), alternate ice and heat (or stick with whichever feels best) 20 minutes on 20 minutes off. 3. Follow Up: Please followup with your primary doctor in 3-7 days for discussion of your diagnoses and further evaluation after today's visit; if you do not have a primary care doctor use the resource guide provided to find one;  Return to the ER for worsening back pain, difficulty walking, loss of bowel or bladder control or other concerning symptoms

## 2017-09-12 NOTE — ED Provider Notes (Signed)
[facility] EMERGENCY DEPARTMENT Provider Note   CSN: [id number] Arrival date & time: 09/12/17  1256     History   Chief Complaint Chief Complaint  Patient presents with  . Back Pain    HPI Joseph Young is a 42 y.o. male with no significant past medical history presents today for evaluation of acute onset, intermittent right low back pain since yesterday.  Patient states that his pain began after sawing wood with a chainsaw.  Pain is left-sided, does not radiate.  It worsens with ambulation and bending and twisting.  Denies any recent trauma or falls.  He denies numbness, weakness, saddle anesthesia, bowel or bladder incontinence, fevers, or history of IV drug use.  No history of cancer or diabetes mellitus.  He has tried 2 tablets of 800 mg of ibuprofen without relief of his symptoms.  The history is provided by the patient.    History reviewed. No pertinent past medical history.  Patient Active Problem List   Diagnosis Date Noted  . Cervical sprain 11/10/2013  . Sprain, thumb 11/10/2013  . Elbow contusion 11/03/2013    History reviewed. No pertinent surgical history.      Home Medications    Prior to Admission medications   Medication Sig Start Date End Date Taking? Authorizing Provider  cephALEXin (KEFLEX) 500 MG capsule Take 1 capsule (500 mg total) by mouth 4 (four) times daily. 10/31/13   Ripley Fraise, MD  cyclobenzaprine (FLEXERIL) 10 MG tablet Take 1 tablet (10 mg total) by mouth 2 (two) times daily as needed for muscle spasms. 09/12/17   Fawze, Mina A, PA-C  HYDROcodone-acetaminophen (NORCO/VICODIN) 5-325 MG per tablet Take 1 tablet by mouth every 4 (four) hours as needed for moderate pain. 12/08/13   Carole Civil, MD  ibuprofen (ADVIL,MOTRIN) 800 MG tablet Take 1 tablet (800 mg total) by mouth 3 (three) times daily. Take with food 02/23/17   Triplett, Tammy, PA-C  oxyCODONE-acetaminophen (PERCOCET/ROXICET) 5-325 MG tablet Take 1 tablet by mouth every 4  (four) hours as needed. 02/23/17   Triplett, Tammy, PA-C  predniSONE (STERAPRED UNI-PAK 21 TAB) 10 MG (21) TBPK tablet Take by mouth daily. Take 6 tabs by mouth daily  for 2 days, then 5 tabs for 2 days, then 4 tabs for 2 days, then 3 tabs for 2 days, 2 tabs for 2 days, then 1 tab by mouth daily for 2 days 09/12/17   Renita Papa, PA-C    Family History History reviewed. No pertinent family history.  Social History Social History   Tobacco Use  . Smoking status: Current Every Day Smoker    Packs/day: 0.50    Types: Cigarettes  . Smokeless tobacco: Never Used  Substance Use Topics  . Alcohol use: Yes    Alcohol/week: 3.6 oz    Types: 6 Cans of beer per week  . Drug use: No     Allergies   Patient has no known allergies.   Review of Systems Review of Systems  Constitutional: Negative for fever.  Musculoskeletal: Positive for back pain.  Neurological: Negative for syncope, weakness and numbness.     Physical Exam Updated Vital Signs BP 136/80 (BP Location: Right Arm)   Pulse 82   Temp 98.5 F (36.9 C) (Oral)   Resp 16   Ht 6\' 1"  (1.854 m)   Wt 77.1 kg (170 lb)   SpO2 100%   BMI 22.43 kg/m   Physical Exam  Constitutional: He appears well-developed and well-nourished. No distress.  Appears somewhat uncomfortable, sitting upright and somewhat stiff in general.  HENT:  Head: Normocephalic and atraumatic.  Eyes: Conjunctivae are normal. Right eye exhibits no discharge. Left eye exhibits no discharge.  Neck: No JVD present. No tracheal deviation present.  Cardiovascular: Normal rate and intact distal pulses.  2+ DP/PT pulses bilaterally, no lower extremity edema bilaterally.  No palpable cords, Homans sign absent bilaterally.  Pulmonary/Chest: Effort normal.  Abdominal: He exhibits no distension.  Musculoskeletal: He exhibits tenderness. He exhibits no edema.  No midline tenderness to palpation of the spine.  There is mild left paralumbar muscle tenderness.  No SI  joint tenderness.  Negative straight leg raise bilaterally.  No deformity, crepitus, or step-off noted.  5/5 strength of BLE major muscle groups.  There is decreased range of motion of the lumbar spine with extension and flexion.  Pain elicited with flexion and extension but not lateral rotation.  Neurological: He is alert. No sensory deficit. He exhibits normal muscle tone.  Fluent speech, no facial droop, sensation intact to soft touch of bilateral lower extremities, ambulates with mildly antalgic gait but exhibits good balance.  Patient able to heel walk and toe walk without difficulty.   Skin: Skin is warm and dry. No erythema.  Psychiatric: He has a normal mood and affect. His behavior is normal.  Nursing note and vitals reviewed.    ED Treatments / Results  Labs (all labs ordered are listed, but only abnormal results are displayed) Labs Reviewed - No data to display  EKG None  Radiology No results found.  Procedures Procedures (including critical care time)  Medications Ordered in ED Medications  cyclobenzaprine (FLEXERIL) tablet 10 mg (has no administration in time range)  HYDROcodone-acetaminophen (NORCO/VICODIN) 5-325 MG per tablet 1 tablet (has no administration in time range)     Initial Impression / Assessment and Plan / ED Course  I have reviewed the triage vital signs and the nursing notes.  Pertinent labs & imaging results that were available during my care of the patient were reviewed by me and considered in my medical decision making (see chart for details).     Patient with back pain.  He is afebrile, vital signs are stable.  He is nontoxic in appearance although uncomfortable.  Appears to be musculoskeletal in etiology, elicited with active range of motion and palpation.  No neurological deficits and normal neuro exam.  Patient can walk but states is painful.  No loss of bowel or bladder control.  No concern for cauda equina.  No fever, night sweats, weight  loss, h/o cancer, IVDU.  No concern for dissection.  RICE protocol and pain medicine indicated and discussed with patient.  Will discharge with steroid taper and Flexeril.  Recommend follow-up with PCP or orthopedist for reevaluation.  Discussed strict ED return precautions.  Patient and patient's significant other verbalized understanding of and agreement with plan and patient is stable for discharge home at this time.   Final Clinical Impressions(s) / ED Diagnoses   Final diagnoses:  Acute left-sided low back pain without sciatica    ED Discharge Orders        Ordered    predniSONE (STERAPRED UNI-PAK 21 TAB) 10 MG (21) TBPK tablet  Daily     09/12/17 1346    cyclobenzaprine (FLEXERIL) 10 MG tablet  2 times daily PRN     09/12/17 1346       Fawze, Victor A, PA-C 09/12/17 1348    Isla Pence, MD 09/12/17 1615

## 2017-09-12 NOTE — ED Triage Notes (Signed)
Pt c/o back pain X2 days after sawing wood, denies back problems. Motrin without relief. Denies bowel/bladder incontinence.

## 2019-09-30 ENCOUNTER — Encounter: Payer: Self-pay | Admitting: General Surgery

## 2019-09-30 ENCOUNTER — Other Ambulatory Visit: Payer: Self-pay

## 2019-09-30 ENCOUNTER — Ambulatory Visit (INDEPENDENT_AMBULATORY_CARE_PROVIDER_SITE_OTHER): Payer: Worker's Compensation | Admitting: General Surgery

## 2019-09-30 VITALS — BP 113/72 | HR 62 | Temp 98.7°F | Resp 16 | Ht 73.0 in | Wt 182.0 lb

## 2019-09-30 DIAGNOSIS — K4090 Unilateral inguinal hernia, without obstruction or gangrene, not specified as recurrent: Secondary | ICD-10-CM

## 2019-09-30 MED ORDER — HYDROCODONE-ACETAMINOPHEN 5-325 MG PO TABS: 1.0000 | ORAL_TABLET | ORAL | 0 refills | Status: DC | PRN

## 2019-09-30 NOTE — Progress Notes (Signed)
Joseph Young; [id number]; 1975/04/20   HPI Patient is a 44 year old black male who was referred to my care for evaluation treatment of a right inguinal hernia.  He sustained acute onset of pain and swelling in the right groin region while at work lifting something heavy earlier this month.  He has since had ongoing pain with straining or movement in the right groin.  He denies any nausea or vomiting. History reviewed. No pertinent past medical history.  History reviewed. No pertinent surgical history.  History reviewed. No pertinent family history.  No current outpatient medications on file prior to visit.   No current facility-administered medications on file prior to visit.    No Known Allergies  Social History   Substance and Sexual Activity  Alcohol Use Yes  . Alcohol/week: 6.0 standard drinks  . Types: 6 Cans of beer per week    Social History   Tobacco Use  Smoking Status Current Every Day Smoker  . Packs/day: 0.50  . Types: Cigarettes  Smokeless Tobacco Never Used    Review of Systems  Constitutional: Negative.   HENT: Negative.   Eyes: Negative.   Respiratory: Negative.   Cardiovascular: Negative.   Gastrointestinal: Positive for abdominal pain.  Genitourinary: Negative.   Musculoskeletal: Positive for back pain and neck pain.  Skin: Negative.   Neurological: Negative.   Endo/Heme/Allergies: Negative.   Psychiatric/Behavioral: Negative.     Objective   Vitals:   09/30/19 1508  BP: 113/72  Pulse: 62  Resp: 16  Temp: 98.7 F (37.1 C)  SpO2: 98%    Physical Exam Vitals reviewed.  Constitutional:      Appearance: Normal appearance. He is normal weight. He is not ill-appearing.  HENT:     Head: Normocephalic and atraumatic.  Cardiovascular:     Rate and Rhythm: Normal rate and regular rhythm.     Heart sounds: Normal heart sounds. No murmur heard.  No friction rub. No gallop.   Pulmonary:     Effort: Pulmonary effort is normal. No  respiratory distress.     Breath sounds: Normal breath sounds. No stridor. No wheezing, rhonchi or rales.  Abdominal:     General: Abdomen is flat. Bowel sounds are normal. There is no distension.     Palpations: Abdomen is soft. There is no mass.     Tenderness: There is no abdominal tenderness. There is no guarding or rebound.     Hernia: A hernia is present.     Comments: Small reducible right inguinal hernia noted.  Genitourinary:    Comments: Genitourinary examination is within normal limits Skin:    General: Skin is warm and dry.  Neurological:     Mental Status: He is alert and oriented to person, place, and time.     Assessment  Right inguinal hernia, symptomatic Plan   We will schedule right inguinal herniorrhaphy with mesh once his workmen's compensation has been arranged.  The risks and benefits of the procedure including bleeding, infection, mesh use, and the possibility of recurrence of the hernia were fully explained to the patient, who gave informed consent.

## 2019-09-30 NOTE — Patient Instructions (Signed)

## 2019-10-01 ENCOUNTER — Encounter: Payer: Self-pay | Admitting: Family Medicine

## 2019-10-02 NOTE — H&P (Signed)
Joseph Young; [id number]; Jan 23, 1976   HPI Patient is a 44 year old black male who was referred to my care for evaluation treatment of a right inguinal hernia.  He sustained acute onset of pain and swelling in the right groin region while at work lifting something heavy earlier this month.  He has since had ongoing pain with straining or movement in the right groin.  He denies any nausea or vomiting. History reviewed. No pertinent past medical history.  History reviewed. No pertinent surgical history.  History reviewed. No pertinent family history.  No current outpatient medications on file prior to visit.   No current facility-administered medications on file prior to visit.    No Known Allergies  Social History   Substance and Sexual Activity  Alcohol Use Yes  . Alcohol/week: 6.0 standard drinks  . Types: 6 Cans of beer per week    Social History   Tobacco Use  Smoking Status Current Every Day Smoker  . Packs/day: 0.50  . Types: Cigarettes  Smokeless Tobacco Never Used    Review of Systems  Constitutional: Negative.   HENT: Negative.   Eyes: Negative.   Respiratory: Negative.   Cardiovascular: Negative.   Gastrointestinal: Positive for abdominal pain.  Genitourinary: Negative.   Musculoskeletal: Positive for back pain and neck pain.  Skin: Negative.   Neurological: Negative.   Endo/Heme/Allergies: Negative.   Psychiatric/Behavioral: Negative.     Objective   Vitals:   09/30/19 1508  BP: 113/72  Pulse: 62  Resp: 16  Temp: 98.7 F (37.1 C)  SpO2: 98%    Physical Exam Vitals reviewed.  Constitutional:      Appearance: Normal appearance. He is normal weight. He is not ill-appearing.  HENT:     Head: Normocephalic and atraumatic.  Cardiovascular:     Rate and Rhythm: Normal rate and regular rhythm.     Heart sounds: Normal heart sounds. No murmur heard.  No friction rub. No gallop.   Pulmonary:     Effort: Pulmonary effort is normal. No  respiratory distress.     Breath sounds: Normal breath sounds. No stridor. No wheezing, rhonchi or rales.  Abdominal:     General: Abdomen is flat. Bowel sounds are normal. There is no distension.     Palpations: Abdomen is soft. There is no mass.     Tenderness: There is no abdominal tenderness. There is no guarding or rebound.     Hernia: A hernia is present.     Comments: Small reducible right inguinal hernia noted.  Genitourinary:    Comments: Genitourinary examination is within normal limits Skin:    General: Skin is warm and dry.  Neurological:     Mental Status: He is alert and oriented to person, place, and time.     Assessment  Right inguinal hernia, symptomatic Plan   We will schedule right inguinal herniorrhaphy with mesh once his workmen's compensation has been arranged.  The risks and benefits of the procedure including bleeding, infection, mesh use, and the possibility of recurrence of the hernia were fully explained to the patient, who gave informed consent.

## 2019-10-08 ENCOUNTER — Encounter (HOSPITAL_COMMUNITY): Payer: Self-pay

## 2019-10-08 ENCOUNTER — Encounter (HOSPITAL_COMMUNITY)
Admission: RE | Admit: 2019-10-08 | Discharge: 2019-10-08 | Disposition: A | Discharge status: Home / Self Care | Source: Ambulatory Visit | Attending: General Surgery | Admitting: General Surgery

## 2019-10-08 ENCOUNTER — Other Ambulatory Visit: Payer: Self-pay

## 2019-10-08 ENCOUNTER — Other Ambulatory Visit (HOSPITAL_COMMUNITY)
Admission: RE | Admit: 2019-10-08 | Discharge: 2019-10-08 | Disposition: A | Discharge status: Home / Self Care | Payer: Self-pay | Source: Ambulatory Visit | Attending: General Surgery | Admitting: General Surgery

## 2019-10-08 DIAGNOSIS — Z20822 Contact with and (suspected) exposure to covid-19: Secondary | ICD-10-CM | POA: Diagnosis not present

## 2019-10-08 DIAGNOSIS — Z01812 Encounter for preprocedural laboratory examination: Secondary | ICD-10-CM | POA: Diagnosis present

## 2019-10-08 LAB — SARS CORONAVIRUS 2 (TAT 6-24 HRS): SARS Coronavirus 2: NEGATIVE

## 2019-10-08 NOTE — Patient Instructions (Signed)
Joseph Young  10/08/2019     @PREFPERIOPPHARMACY @   Your procedure is scheduled on  10/10/2019 .  Report to [name] at  [place].  Call this number if you have problems the morning of surgery:  [phone]   Remember:  Do not eat or drink after midnight.                        Take these medicines the morning of surgery with A SIP OF WATER  Hydrocodone(if needed)    Do not wear jewelry, make-up or nail polish.  Do not wear lotions, powders, or perfumes, or deodorant.  Do not shave 48 hours prior to surgery.  Men may shave face and neck.  Do not bring valuables to the hospital.  [facility] is not responsible for any belongings or valuables.  Contacts, dentures or bridgework may not be worn into surgery.  Leave your suitcase in the car.  After surgery it may be brought to your room.  For patients admitted to the hospital, discharge time will be determined by your treatment team.  Patients discharged the day of surgery will not be allowed to drive home.   Name and phone number of your driver:   family Special instructions:  DO NOT smoke the morning of your procedure.  Please read over the following fact sheets that you were given. Anesthesia Post-op Instructions and Care and Recovery After Surgery       Open Hernia Repair, Adult, Care After These instructions give you information about caring for yourself after your procedure. Your doctor may also give you more specific instructions. If you have problems or questions, contact your doctor. Follow these instructions at home: Surgical cut (incision) care   Follow instructions from your doctor about how to take care of your surgical cut area. Make sure you: ? Wash your hands with soap and water before you change your bandage (dressing). If you cannot use soap and water, use hand sanitizer. ? Change your bandage as told by your doctor. ? Leave stitches (sutures), skin glue, or skin tape (adhesive) strips in  place. They may need to stay in place for 2 weeks or longer. If tape strips get loose and curl up, you may trim the loose edges. Do not remove tape strips completely unless your doctor says it is okay.  Check your surgical cut every day for signs of infection. Check for: ? More redness, swelling, or pain. ? More fluid or blood. ? Warmth. ? Pus or a bad smell. Activity  Do not drive or use heavy machinery while taking prescription pain medicine. Do not drive until your doctor says it is okay.  Until your doctor says it is okay: ? Do not lift anything that is heavier than 10 lb (4.5 kg). ? Do not play contact sports.  Return to your normal activities as told by your doctor. Ask your doctor what activities are safe. General instructions  To prevent or treat having a hard time pooping (constipation) while you are taking prescription pain medicine, your doctor may recommend that you: ? Drink enough fluid to keep your pee (urine) clear or pale yellow. ? Take over-the-counter or prescription medicines. ? Eat foods that are high in fiber, such as fresh fruits and vegetables, whole grains, and beans. ? Limit foods that are high in fat and processed sugars, such as fried and sweet foods.  Take over-the-counter and  prescription medicines only as told by your doctor.  Do not take baths, swim, or use a hot tub until your doctor says it is okay.  Keep all follow-up visits as told by your doctor. This is important. Contact a doctor if:  You develop a rash.  You have more redness, swelling, or pain around your surgical cut.  You have more fluid or blood coming from your surgical cut.  Your surgical cut feels warm to the touch.  You have pus or a bad smell coming from your surgical cut.  You have a fever or chills.  You have blood in your poop (stool).  You have not pooped in 2-3 days.  Medicine does not help your pain. Get help right away if:  You have chest pain or you are short of  breath.  You feel light-headed.  You feel weak and dizzy (feel faint).  You have very bad pain.  You throw up (vomit) and your pain is worse. This information is not intended to replace advice given to you by your health care provider. Make sure you discuss any questions you have with your health care provider. Document Revised: 06/21/2018 Document Reviewed: 08/11/2015 Elsevier Patient Education  2020 [place] Anesthesia, Adult, Care After This sheet gives you information about how to care for yourself after your procedure. Your health care provider may also give you more specific instructions. If you have problems or questions, contact your health care provider. What can I expect after the procedure? After the procedure, the following side effects are common:  Pain or discomfort at the IV site.  Nausea.  Vomiting.  Sore throat.  Trouble concentrating.  Feeling cold or chills.  Weak or tired.  Sleepiness and fatigue.  Soreness and body aches. These side effects can affect parts of the body that were not involved in surgery. Follow these instructions at home:  For at least 24 hours after the procedure:  Have a responsible adult stay with you. It is important to have someone help care for you until you are awake and alert.  Rest as needed.  Do not: ? Participate in activities in which you could fall or become injured. ? Drive. ? Use heavy machinery. ? Drink alcohol. ? Take sleeping pills or medicines that cause drowsiness. ? Make important decisions or sign legal documents. ? Take care of children on your own. Eating and drinking  Follow any instructions from your health care provider about eating or drinking restrictions.  When you feel hungry, start by eating small amounts of foods that are soft and easy to digest (bland), such as toast. Gradually return to your regular diet.  Drink enough fluid to keep your urine pale yellow.  If you vomit,  rehydrate by drinking water, juice, or clear broth. General instructions  If you have sleep apnea, surgery and certain medicines can increase your risk for breathing problems. Follow instructions from your health care provider about wearing your sleep device: ? Anytime you are sleeping, including during daytime naps. ? While taking prescription pain medicines, sleeping medicines, or medicines that make you drowsy.  Return to your normal activities as told by your health care provider. Ask your health care provider what activities are safe for you.  Take over-the-counter and prescription medicines only as told by your health care provider.  If you smoke, do not smoke without supervision.  Keep all follow-up visits as told by your health care provider. This is important. Contact a health care  provider if:  You have nausea or vomiting that does not get better with medicine.  You cannot eat or drink without vomiting.  You have pain that does not get better with medicine.  You are unable to pass urine.  You develop a skin rash.  You have a fever.  You have redness around your IV site that gets worse. Get help right away if:  You have difficulty breathing.  You have chest pain.  You have blood in your urine or stool, or you vomit blood. Summary  After the procedure, it is common to have a sore throat or nausea. It is also common to feel tired.  Have a responsible adult stay with you for the first 24 hours after general anesthesia. It is important to have someone help care for you until you are awake and alert.  When you feel hungry, start by eating small amounts of foods that are soft and easy to digest (bland), such as toast. Gradually return to your regular diet.  Drink enough fluid to keep your urine pale yellow.  Return to your normal activities as told by your health care provider. Ask your health care provider what activities are safe for you. This information is not  intended to replace advice given to you by your health care provider. Make sure you discuss any questions you have with your health care provider. Document Revised: 03/02/2017 Document Reviewed: 10/13/2016 Elsevier Patient Education  [place]. How to Use Chlorhexidine for Bathing Chlorhexidine gluconate (CHG) is a germ-killing (antiseptic) solution that is used to clean the skin. It can get rid of the bacteria that normally live on the skin and can keep them away for about 24 hours. To clean your skin with CHG, you may be given:  A CHG solution to use in the shower or as part of a sponge bath.  A prepackaged cloth that contains CHG. Cleaning your skin with CHG may help lower the risk for infection:  While you are staying in the intensive care unit of the hospital.  If you have a vascular access, such as a central line, to provide short-term or long-term access to your veins.  If you have a catheter to drain urine from your bladder.  If you are on a ventilator. A ventilator is a machine that helps you breathe by moving air in and out of your lungs.  After surgery. What are the risks? Risks of using CHG include:  A skin reaction.  Hearing loss, if CHG gets in your ears.  Eye injury, if CHG gets in your eyes and is not rinsed out.  The CHG product catching fire. Make sure that you avoid smoking and flames after applying CHG to your skin. Do not use CHG:  If you have a chlorhexidine allergy or have previously reacted to chlorhexidine.  On babies younger than 43 months of age. How to use CHG solution  Use CHG only as told by your health care provider, and follow the instructions on the label.  Use the full amount of CHG as directed. Usually, this is one bottle. During a shower Follow these steps when using CHG solution during a shower (unless your health care provider gives you different instructions): 1. Start the shower. 2. Use your normal soap and shampoo to wash  your face and hair. 3. Turn off the shower or move out of the shower stream. 4. Pour the CHG onto a clean washcloth. Do not use any type of brush or  rough-edged sponge. 5. Starting at your neck, lather your body down to your toes. Make sure you follow these instructions: ? If you will be having surgery, pay special attention to the part of your body where you will be having surgery. Scrub this area for at least 1 minute. ? Do not use CHG on your head or face. If the solution gets into your ears or eyes, rinse them well with water. ? Avoid your genital area. ? Avoid any areas of skin that have broken skin, cuts, or scrapes. ? Scrub your back and under your arms. Make sure to wash skin folds. 6. Let the lather sit on your skin for 1-2 minutes or as long as told by your health care provider. 7. Thoroughly rinse your entire body in the shower. Make sure that all body creases and crevices are rinsed well. 8. Dry off with a clean towel. Do not put any substances on your body afterward--such as powder, lotion, or perfume--unless you are told to do so by your health care provider. Only use lotions that are recommended by the manufacturer. 9. Put on clean clothes or pajamas. 10. If it is the night before your surgery, sleep in clean sheets.  During a sponge bath Follow these steps when using CHG solution during a sponge bath (unless your health care provider gives you different instructions): 1. Use your normal soap and shampoo to wash your face and hair. 2. Pour the CHG onto a clean washcloth. 3. Starting at your neck, lather your body down to your toes. Make sure you follow these instructions: ? If you will be having surgery, pay special attention to the part of your body where you will be having surgery. Scrub this area for at least 1 minute. ? Do not use CHG on your head or face. If the solution gets into your ears or eyes, rinse them well with water. ? Avoid your genital area. ? Avoid any areas of  skin that have broken skin, cuts, or scrapes. ? Scrub your back and under your arms. Make sure to wash skin folds. 4. Let the lather sit on your skin for 1-2 minutes or as long as told by your health care provider. 5. Using a different clean, wet washcloth, thoroughly rinse your entire body. Make sure that all body creases and crevices are rinsed well. 6. Dry off with a clean towel. Do not put any substances on your body afterward--such as powder, lotion, or perfume--unless you are told to do so by your health care provider. Only use lotions that are recommended by the manufacturer. 7. Put on clean clothes or pajamas. 8. If it is the night before your surgery, sleep in clean sheets. How to use CHG prepackaged cloths  Only use CHG cloths as told by your health care provider, and follow the instructions on the label.  Use the CHG cloth on clean, dry skin.  Do not use the CHG cloth on your head or face unless your health care provider tells you to.  When washing with the CHG cloth: ? Avoid your genital area. ? Avoid any areas of skin that have broken skin, cuts, or scrapes. Before surgery Follow these steps when using a CHG cloth to clean before surgery (unless your health care provider gives you different instructions): 1. Using the CHG cloth, vigorously scrub the part of your body where you will be having surgery. Scrub using a back-and-forth motion for 3 minutes. The area on your body should be completely  wet with CHG when you are done scrubbing. 2. Do not rinse. Discard the cloth and let the area air-dry. Do not put any substances on the area afterward, such as powder, lotion, or perfume. 3. Put on clean clothes or pajamas. 4. If it is the night before your surgery, sleep in clean sheets.  For general bathing Follow these steps when using CHG cloths for general bathing (unless your health care provider gives you different instructions). 1. Use a separate CHG cloth for each area of your  body. Make sure you wash between any folds of skin and between your fingers and toes. Wash your body in the following order, switching to a new cloth after each step: ? The front of your neck, shoulders, and chest. ? Both of your arms, under your arms, and your hands. ? Your stomach and groin area, avoiding the genitals. ? Your right leg and foot. ? Your left leg and foot. ? The back of your neck, your back, and your buttocks. 2. Do not rinse. Discard the cloth and let the area air-dry. Do not put any substances on your body afterward--such as powder, lotion, or perfume--unless you are told to do so by your health care provider. Only use lotions that are recommended by the manufacturer. 3. Put on clean clothes or pajamas. Contact a health care provider if:  Your skin gets irritated after scrubbing.  You have questions about using your solution or cloth. Get help right away if:  Your eyes become very red or swollen.  Your eyes itch badly.  Your skin itches badly and is red or swollen.  Your hearing changes.  You have trouble seeing.  You have swelling or tingling in your mouth or throat.  You have trouble breathing.  You swallow any chlorhexidine. Summary  Chlorhexidine gluconate (CHG) is a germ-killing (antiseptic) solution that is used to clean the skin. Cleaning your skin with CHG may help to lower your risk for infection.  You may be given CHG to use for bathing. It may be in a bottle or in a prepackaged cloth to use on your skin. Carefully follow your health care provider's instructions and the instructions on the product label.  Do not use CHG if you have a chlorhexidine allergy.  Contact your health care provider if your skin gets irritated after scrubbing. This information is not intended to replace advice given to you by your health care provider. Make sure you discuss any questions you have with your health care provider. Document Revised: 05/16/2018 Document  Reviewed: 01/25/2017 Elsevier Patient Education  [place].

## 2019-10-10 ENCOUNTER — Ambulatory Visit (HOSPITAL_COMMUNITY): Admitting: Anesthesiology

## 2019-10-10 ENCOUNTER — Encounter (HOSPITAL_COMMUNITY)
Admission: RE | Disposition: A | Discharge status: Home / Self Care | Payer: Self-pay | Source: Home / Self Care | Attending: General Surgery

## 2019-10-10 ENCOUNTER — Ambulatory Visit (HOSPITAL_COMMUNITY)
Admission: RE | Admit: 2019-10-10 | Discharge: 2019-10-10 | Disposition: A | Discharge status: Home / Self Care | Attending: General Surgery | Admitting: General Surgery

## 2019-10-10 DIAGNOSIS — K4090 Unilateral inguinal hernia, without obstruction or gangrene, not specified as recurrent: Secondary | ICD-10-CM

## 2019-10-10 DIAGNOSIS — F17210 Nicotine dependence, cigarettes, uncomplicated: Secondary | ICD-10-CM | POA: Diagnosis not present

## 2019-10-10 HISTORY — PX: INGUINAL HERNIA REPAIR: SHX194

## 2019-10-10 SURGERY — REPAIR, HERNIA, INGUINAL, ADULT
Anesthesia: General | Laterality: Right

## 2019-10-10 MED ORDER — FENTANYL CITRATE (PF) 100 MCG/2ML IJ SOLN
INTRAMUSCULAR | Status: AC
  Filled 2019-10-10: qty 2

## 2019-10-10 MED ORDER — SODIUM CHLORIDE 0.9 % IR SOLN
Status: DC | PRN
  Administered 2019-10-10: 1000 mL

## 2019-10-10 MED ORDER — MEPERIDINE HCL 50 MG/ML IJ SOLN: 6.2500 mg | INTRAMUSCULAR | Status: DC | PRN

## 2019-10-10 MED ORDER — BUPIVACAINE LIPOSOME 1.3 % IJ SUSP
INTRAMUSCULAR | Status: AC
  Filled 2019-10-10: qty 20

## 2019-10-10 MED ORDER — DEXAMETHASONE SODIUM PHOSPHATE 10 MG/ML IJ SOLN
INTRAMUSCULAR | Status: DC | PRN
  Administered 2019-10-10: 10 mg via INTRAVENOUS

## 2019-10-10 MED ORDER — PROPOFOL 10 MG/ML IV BOLUS
INTRAVENOUS | Status: AC
  Filled 2019-10-10: qty 40

## 2019-10-10 MED ORDER — DEXMEDETOMIDINE HCL IN NACL 200 MCG/50ML IV SOLN
INTRAVENOUS | Status: DC | PRN
  Administered 2019-10-10: 8 ug via INTRAVENOUS

## 2019-10-10 MED ORDER — LIDOCAINE HCL (CARDIAC) PF 100 MG/5ML IV SOSY
PREFILLED_SYRINGE | INTRAVENOUS | Status: DC | PRN
  Administered 2019-10-10: 100 mg via INTRAVENOUS

## 2019-10-10 MED ORDER — PROPOFOL 10 MG/ML IV BOLUS
INTRAVENOUS | Status: DC | PRN
  Administered 2019-10-10: 250 mg via INTRAVENOUS
  Administered 2019-10-10: 50 mg via INTRAVENOUS

## 2019-10-10 MED ORDER — LACTATED RINGERS IV SOLN: Freq: Once | INTRAVENOUS | Status: AC

## 2019-10-10 MED ORDER — CEFAZOLIN SODIUM-DEXTROSE 2-4 GM/100ML-% IV SOLN
2.0000 g | INTRAVENOUS | Status: AC
  Administered 2019-10-10: 2 g via INTRAVENOUS
  Filled 2019-10-10: qty 100

## 2019-10-10 MED ORDER — LIDOCAINE 2% (20 MG/ML) 5 ML SYRINGE
INTRAMUSCULAR | Status: AC
  Filled 2019-10-10: qty 5

## 2019-10-10 MED ORDER — DEXMEDETOMIDINE HCL IN NACL 200 MCG/50ML IV SOLN
INTRAVENOUS | Status: AC
  Filled 2019-10-10: qty 50

## 2019-10-10 MED ORDER — BUPIVACAINE LIPOSOME 1.3 % IJ SUSP
INTRAMUSCULAR | Status: DC | PRN
  Administered 2019-10-10: 15 mL

## 2019-10-10 MED ORDER — MIDAZOLAM HCL 2 MG/2ML IJ SOLN
INTRAMUSCULAR | Status: AC
  Filled 2019-10-10: qty 2

## 2019-10-10 MED ORDER — PROMETHAZINE HCL 25 MG/ML IJ SOLN: 6.2500 mg | INTRAMUSCULAR | Status: DC | PRN

## 2019-10-10 MED ORDER — KETOROLAC TROMETHAMINE 30 MG/ML IJ SOLN
30.0000 mg | Freq: Once | INTRAMUSCULAR | Status: AC
  Administered 2019-10-10: 30 mg via INTRAVENOUS
  Filled 2019-10-10: qty 1

## 2019-10-10 MED ORDER — ONDANSETRON HCL 4 MG/2ML IJ SOLN
INTRAMUSCULAR | Status: DC | PRN
  Administered 2019-10-10: 4 mg via INTRAVENOUS

## 2019-10-10 MED ORDER — ORAL CARE MOUTH RINSE: 15.0000 mL | Freq: Once | OROMUCOSAL | Status: AC

## 2019-10-10 MED ORDER — HYDROMORPHONE HCL 1 MG/ML IJ SOLN: 0.2500 mg | INTRAMUSCULAR | Status: DC | PRN

## 2019-10-10 MED ORDER — FENTANYL CITRATE (PF) 100 MCG/2ML IJ SOLN
INTRAMUSCULAR | Status: DC | PRN
  Administered 2019-10-10 (×2): 50 ug via INTRAVENOUS
  Administered 2019-10-10: 100 ug via INTRAVENOUS

## 2019-10-10 MED ORDER — CHLORHEXIDINE GLUCONATE CLOTH 2 % EX PADS: 6.0000 | MEDICATED_PAD | Freq: Once | CUTANEOUS | Status: DC

## 2019-10-10 MED ORDER — MIDAZOLAM HCL 5 MG/5ML IJ SOLN
INTRAMUSCULAR | Status: DC | PRN
  Administered 2019-10-10: 2 mg via INTRAVENOUS

## 2019-10-10 MED ORDER — LACTATED RINGERS IV SOLN: INTRAVENOUS | Status: DC | PRN

## 2019-10-10 MED ORDER — HYDROCODONE-ACETAMINOPHEN 5-325 MG PO TABS: 1.0000 | ORAL_TABLET | ORAL | 0 refills | Status: DC | PRN

## 2019-10-10 MED ORDER — CHLORHEXIDINE GLUCONATE 0.12 % MT SOLN
15.0000 mL | Freq: Once | OROMUCOSAL | Status: AC
  Administered 2019-10-10: 15 mL via OROMUCOSAL
  Filled 2019-10-10: qty 15

## 2019-10-10 MED ORDER — EPHEDRINE SULFATE 50 MG/ML IJ SOLN
INTRAMUSCULAR | Status: DC | PRN
  Administered 2019-10-10: 10 mg via INTRAVENOUS

## 2019-10-10 SURGICAL SUPPLY — 37 items
ADH SKN CLS APL DERMABOND .7 (GAUZE/BANDAGES/DRESSINGS) ×1
CLOTH BEACON ORANGE TIMEOUT ST (SAFETY) ×2 IMPLANT
COVER LIGHT HANDLE STERIS (MISCELLANEOUS) ×4 IMPLANT
COVER WAND RF STERILE (DRAPES) ×2 IMPLANT
DERMABOND ADVANCED (GAUZE/BANDAGES/DRESSINGS) ×1
DERMABOND ADVANCED .7 DNX12 (GAUZE/BANDAGES/DRESSINGS) ×1 IMPLANT
DRAIN PENROSE 0.5X18 (DRAIN) ×2 IMPLANT
ELECT REM PT RETURN 9FT ADLT (ELECTROSURGICAL) ×2
ELECTRODE REM PT RTRN 9FT ADLT (ELECTROSURGICAL) ×1 IMPLANT
GAUZE SPONGE 4X4 12PLY STRL (GAUZE/BANDAGES/DRESSINGS) ×2 IMPLANT
GLOVE BIOGEL PI IND STRL 7.0 (GLOVE) ×3 IMPLANT
GLOVE BIOGEL PI INDICATOR 7.0 (GLOVE) ×3
GLOVE SURG SS PI 7.5 STRL IVOR (GLOVE) ×2 IMPLANT
GOWN STRL REUS W/TWL LRG LVL3 (GOWN DISPOSABLE) ×6 IMPLANT
INST SET MINOR GENERAL (KITS) ×2 IMPLANT
KIT TURNOVER KIT A (KITS) ×2 IMPLANT
MANIFOLD NEPTUNE II (INSTRUMENTS) ×2 IMPLANT
MESH MARLEX PLUG MEDIUM (Mesh General) ×2 IMPLANT
NEEDLE HYPO 18GX1.5 BLUNT FILL (NEEDLE) ×2 IMPLANT
NEEDLE HYPO 22GX1.5 SAFETY (NEEDLE) ×2 IMPLANT
NS IRRIG 1000ML POUR BTL (IV SOLUTION) ×2 IMPLANT
PACK MINOR (CUSTOM PROCEDURE TRAY) ×2 IMPLANT
PAD ARMBOARD 7.5X6 YLW CONV (MISCELLANEOUS) ×2 IMPLANT
PENCIL SMOKE EVACUATOR (MISCELLANEOUS) ×2 IMPLANT
SET BASIN LINEN APH (SET/KITS/TRAYS/PACK) ×2 IMPLANT
SOL PREP PROV IODINE SCRUB 4OZ (MISCELLANEOUS) ×2 IMPLANT
SUT MNCRL AB 4-0 PS2 18 (SUTURE) ×2 IMPLANT
SUT NOVA NAB GS-22 2 2-0 T-19 (SUTURE) ×4 IMPLANT
SUT SILK 3 0 (SUTURE)
SUT SILK 3-0 18XBRD TIE 12 (SUTURE) IMPLANT
SUT VIC AB 2-0 CT1 27 (SUTURE) ×2
SUT VIC AB 2-0 CT1 TAPERPNT 27 (SUTURE) ×1 IMPLANT
SUT VIC AB 3-0 SH 27 (SUTURE) ×2
SUT VIC AB 3-0 SH 27X BRD (SUTURE) ×1 IMPLANT
SUT VICRYL AB 2 0 TIES (SUTURE) IMPLANT
SUT VICRYL AB 3 0 TIES (SUTURE) IMPLANT
SYR 20ML LL LF (SYRINGE) ×4 IMPLANT

## 2019-10-10 NOTE — Op Note (Signed)
Patient:  Joseph Young  DOB:  03/10/1976  MRN:  [id number]   Preop Diagnosis: Right inguinal hernia  Postop Diagnosis: Same  Procedure: Right inguinal herniorrhaphy with mesh  Surgeon: Aviva Signs, MD  Anes: General  Indications: Patient is a 44 year old black male who presents with a symptomatic right inguinal hernia.  The risks and benefits of the procedure including bleeding, infection, mesh use, and the possibility of recurrence of the hernia were fully explained to the patient, who gave informed consent.  Procedure note: The patient was placed in the supine position.  After general anesthesia was administered, the right groin region was prepped and draped using the usual sterile technique with Betadine.  Surgical site confirmation was performed.  An incision was made in the right groin region down to the external oblique aponeurosis.  The aponeurosis was incised to the external ring.  A Penrose drain was placed around the spermatic cord.  The vase deferens was noted within the spermatic cord.  Patient was noted to have a small indirect inguinal hernia.  The sac and associated adipose tissue was freed away from the spermatic cord up to the peritoneal reflection and inverted.  A medium size Bard PerFix plug was then inserted.  An onlay patch was placed along the floor of the inguinal canal and secured superiorly to the conjoined tendon and inferiorly to the shelving edge of Poupart's ligament using 2-0 Novafil interrupted sutures.  The internal ring was recreated using a 2-0 Novafil interrupted suture.  The external oblique aponeurosis was reapproximated using a 2-0 Vicryl running suture.  Subcutaneous layer was reapproximated using 3-0 Vicryl interrupted suture.  Exparel was instilled into the surrounding wound.  The skin was closed using a 4-0 Monocryl subcuticular suture.  Dermabond was applied.  All tape and needle counts were correct at the end of the procedure.  The patient was  awakened and transferred to PACU in stable condition.  Complications: None  EBL: Minimal  Specimen: None

## 2019-10-10 NOTE — Transfer of Care (Signed)
Immediate Anesthesia Transfer of Care Note  Patient: Joseph Young  Procedure(s) Performed: HERNIA REPAIR INGUINAL ADULT (Right )  Patient Location: PACU  Anesthesia Type:General  Level of Consciousness: awake, alert , oriented and patient cooperative  Airway & Oxygen Therapy: Patient Spontanous Breathing and Patient connected to nasal cannula oxygen  Post-op Assessment: Report given to RN, Post -op Vital signs reviewed and stable and Patient able to stick tongue midline  Post vital signs: Reviewed and stable  Last Vitals:  Vitals Value Taken Time  BP    Temp    Pulse    Resp    SpO2      Last Pain:  Vitals:   10/10/19 0734  PainSc: 0-No pain         Complications: No complications documented.

## 2019-10-10 NOTE — Interval H&P Note (Signed)
History and Physical Interval Note:  10/10/2019 9:02 AM  Joseph Young  has presented today for surgery, with the diagnosis of Right Inguinal hernia.  The various methods of treatment have been discussed with the patient and family. After consideration of risks, benefits and other options for treatment, the patient has consented to  Procedure(s): HERNIA REPAIR INGUINAL ADULT (Right) as a surgical intervention.  The patient's history has been reviewed, patient examined, no change in status, stable for surgery.  I have reviewed the patient's chart and labs.  Questions were answered to the patient's satisfaction.     Aviva Signs

## 2019-10-10 NOTE — Anesthesia Procedure Notes (Signed)
Procedure Name: LMA Insertion Date/Time: 10/10/2019 9:40 AM Performed by: Jonna Munro, CRNA Pre-anesthesia Checklist: Patient identified, Emergency Drugs available, Suction available, Patient being monitored and Timeout performed Patient Re-evaluated:Patient Re-evaluated prior to induction Oxygen Delivery Method: Circle system utilized Preoxygenation: Pre-oxygenation with 100% oxygen Induction Type: IV induction LMA: LMA inserted LMA Size: 5.0 Number of attempts: 1 Placement Confirmation: positive ETCO2 and breath sounds checked- equal and bilateral Dental Injury: Teeth and Oropharynx as per pre-operative assessment

## 2019-10-10 NOTE — Anesthesia Postprocedure Evaluation (Signed)
Anesthesia Post Note  Patient: Joseph Young  Procedure(s) Performed: HERNIA REPAIR INGUINAL ADULT (Right )  Patient location during evaluation: PACU Anesthesia Type: General Level of consciousness: awake, oriented, awake and alert and patient cooperative Pain management: satisfactory to patient Vital Signs Assessment: post-procedure vital signs reviewed and stable Respiratory status: spontaneous breathing, respiratory function stable and nonlabored ventilation Cardiovascular status: stable Postop Assessment: no apparent nausea or vomiting Anesthetic complications: no   No complications documented.   Last Vitals:  Vitals:   10/10/19 0745 10/10/19 0800  BP:    Pulse: 57 57  Resp: 14 20  Temp:    SpO2: 98% 98%    Last Pain:  Vitals:   10/10/19 0734  PainSc: 0-No pain                 GREGORY,SUZANNE

## 2019-10-10 NOTE — Discharge Instructions (Signed)
PLEASE WEAR THE [place] EXPAREL BRACELET UNTIL Tuesday October 14, 2019. DO NOT RECEIVE ANY FURTHER NUMBING MEDICATIONS WITHOUT CONSULTING A PHYSICIAN       Open Hernia Repair, Adult, Care After This sheet gives you information about how to care for yourself after your procedure. Your health care provider may also give you more specific instructions. If you have problems or questions, contact your health care provider. What can I expect after the procedure? After the procedure, it is common to have:  Mild discomfort.  Slight bruising.  Minor swelling.  Pain in the abdomen. Follow these instructions at home: Incision care   Follow instructions from your health care provider about how to take care of your incision area. Make sure you: ? Wash your hands with soap and water before you change your bandage (dressing). If soap and water are not available, use hand sanitizer. ? Change your dressing as told by your health care provider. ? Leave stitches (sutures), skin glue, or adhesive strips in place. These skin closures may need to stay in place for 2 weeks or longer. If adhesive strip edges start to loosen and curl up, you may trim the loose edges. Do not remove adhesive strips completely unless your health care provider tells you to do that.  Check your incision area every day for signs of infection. Check for: ? More redness, swelling, or pain. ? More fluid or blood. ? Warmth. ? Pus or a bad smell. Activity  Do not drive or use heavy machinery while taking prescription pain medicine. Do not drive until your health care provider approves.  Until your health care provider approves: ? Do not lift anything that is heavier than 10 lb (4.5 kg). ? Do not play contact sports.  Return to your normal activities as told by your health care provider. Ask your health care provider what activities are safe. General instructions  To prevent or treat constipation while you are taking prescription  pain medicine, your health care provider may recommend that you: ? Drink enough fluid to keep your urine clear or pale yellow. ? Take over-the-counter or prescription medicines. ? Eat foods that are high in fiber, such as fresh fruits and vegetables, whole grains, and beans. ? Limit foods that are high in fat and processed sugars, such as fried and sweet foods.  Take over-the-counter and prescription medicines only as told by your health care provider.  Do not take tub baths or go swimming until your health care provider approves.  Keep all follow-up visits as told by your health care provider. This is important. Contact a health care provider if:  You develop a rash.  You have more redness, swelling, or pain around your incision.  You have more fluid or blood coming from your incision.  Your incision feels warm to the touch.  You have pus or a bad smell coming from your incision.  You have a fever or chills.  You have blood in your stool (feces).  You have not had a bowel movement in 2-3 days.  Your pain is not controlled with medicine. Get help right away if:  You have chest pain or shortness of breath.  You feel light-headed or feel faint.  You have severe pain.  You vomit and your pain is worse. This information is not intended to replace advice given to you by your health care provider. Make sure you discuss any questions you have with your health care provider. Document Revised: 02/09/2017 Document Reviewed: 08/11/2015 Elsevier  Patient Education  2020 [place] Anesthesia, Adult, Care After This sheet gives you information about how to care for yourself after your procedure. Your health care provider may also give you more specific instructions. If you have problems or questions, contact your health care provider. What can I expect after the procedure? After the procedure, the following side effects are common:  Pain or discomfort at the IV  site.  Nausea.  Vomiting.  Sore throat.  Trouble concentrating.  Feeling cold or chills.  Weak or tired.  Sleepiness and fatigue.  Soreness and body aches. These side effects can affect parts of the body that were not involved in surgery. Follow these instructions at home:  For at least 24 hours after the procedure:  Have a responsible adult stay with you. It is important to have someone help care for you until you are awake and alert.  Rest as needed.  Do not: ? Participate in activities in which you could fall or become injured. ? Drive. ? Use heavy machinery. ? Drink alcohol. ? Take sleeping pills or medicines that cause drowsiness. ? Make important decisions or sign legal documents. ? Take care of children on your own. Eating and drinking  Follow any instructions from your health care provider about eating or drinking restrictions.  When you feel hungry, start by eating small amounts of foods that are soft and easy to digest (bland), such as toast. Gradually return to your regular diet.  Drink enough fluid to keep your urine pale yellow.  If you vomit, rehydrate by drinking water, juice, or clear broth. General instructions  If you have sleep apnea, surgery and certain medicines can increase your risk for breathing problems. Follow instructions from your health care provider about wearing your sleep device: ? Anytime you are sleeping, including during daytime naps. ? While taking prescription pain medicines, sleeping medicines, or medicines that make you drowsy.  Return to your normal activities as told by your health care provider. Ask your health care provider what activities are safe for you.  Take over-the-counter and prescription medicines only as told by your health care provider.  If you smoke, do not smoke without supervision.  Keep all follow-up visits as told by your health care provider. This is important. Contact a health care provider if:  You  have nausea or vomiting that does not get better with medicine.  You cannot eat or drink without vomiting.  You have pain that does not get better with medicine.  You are unable to pass urine.  You develop a skin rash.  You have a fever.  You have redness around your IV site that gets worse. Get help right away if:  You have difficulty breathing.  You have chest pain.  You have blood in your urine or stool, or you vomit blood. Summary  After the procedure, it is common to have a sore throat or nausea. It is also common to feel tired.  Have a responsible adult stay with you for the first 24 hours after general anesthesia. It is important to have someone help care for you until you are awake and alert.  When you feel hungry, start by eating small amounts of foods that are soft and easy to digest (bland), such as toast. Gradually return to your regular diet.  Drink enough fluid to keep your urine pale yellow.  Return to your normal activities as told by your health care provider. Ask your health care provider  what activities are safe for you. This information is not intended to replace advice given to you by your health care provider. Make sure you discuss any questions you have with your health care provider. Document Revised: 03/02/2017 Document Reviewed: 10/13/2016 Elsevier Patient Education  [place].

## 2019-10-10 NOTE — Anesthesia Preprocedure Evaluation (Signed)
Anesthesia Evaluation  Patient identified by MRN, date of birth, ID band Patient awake    Reviewed: Allergy & Precautions, NPO status , Patient's Chart, lab work & pertinent test results  History of Anesthesia Complications Negative for: history of anesthetic complications  Airway Mallampati: II  TM Distance: >3 FB Neck ROM: Full    Dental  (+) Dental Advisory Given, Missing   Pulmonary Current Smoker and Patient abstained from smoking.,    Pulmonary exam normal breath sounds clear to auscultation       Cardiovascular Exercise Tolerance: Good Normal cardiovascular exam Rhythm:Regular Rate:Normal     Neuro/Psych    GI/Hepatic (+)     substance abuse (5 drinks per day)  alcohol use,   Endo/Other  negative endocrine ROS  Renal/GU negative Renal ROS     Musculoskeletal negative musculoskeletal ROS (+)   Abdominal   Peds  Hematology negative hematology ROS (+)   Anesthesia Other Findings   Reproductive/Obstetrics negative OB ROS                             Anesthesia Physical Anesthesia Plan  ASA: II  Anesthesia Plan: General   Post-op Pain Management:    Induction: Intravenous  PONV Risk Score and Plan: 2 and Ondansetron, Dexamethasone and Midazolam  Airway Management Planned: LMA  Additional Equipment:   Intra-op Plan:   Post-operative Plan: Extubation in OR  Informed Consent: I have reviewed the patients History and Physical, chart, labs and discussed the procedure including the risks, benefits and alternatives for the proposed anesthesia with the patient or authorized representative who has indicated his/her understanding and acceptance.     Dental advisory given  Plan Discussed with: CRNA and Surgeon  Anesthesia Plan Comments:         Anesthesia Quick Evaluation

## 2019-10-13 ENCOUNTER — Telehealth: Payer: Self-pay | Admitting: Family Medicine

## 2019-10-13 ENCOUNTER — Encounter (HOSPITAL_COMMUNITY): Payer: Self-pay | Admitting: General Surgery

## 2019-10-13 MED ORDER — HYDROCODONE-ACETAMINOPHEN 5-325 MG PO TABS: 1.0000 | ORAL_TABLET | ORAL | 0 refills | Status: DC | PRN

## 2019-10-13 NOTE — Telephone Encounter (Signed)
Pt aware of recommendations

## 2019-10-13 NOTE — Telephone Encounter (Signed)
[facility]  Have refilled 10 more pills due to patient having pain. He was unable to receive all 40 from the pharmacy from his prescription and got 30. Can use 1-2 tablets but needs to use ibuprofen and tylenol too. Do not exceed 4000 mg of tylenol a day. Patient aware. Will need to see patient before any additional narcotic are written. Rx written to be filled on 8/3 as patient had 30 tablets filled on 7/30.   Curlene Labrum, MD [facility] [address] [place], [place] [address] [phone] (office)

## 2019-10-13 NOTE — Telephone Encounter (Signed)
Pt called and states that he was only able to get 30 pain pills and Dr. Arnoldo Morale wrote for 40. He would like to know if you would send in another refill for the 10 tablets so that insurance will cover?   (FYI - [employer] will cover if you change the sig to 1-2 q 4 hours - cause as it is written they will not allow the refill of 10 pills until the days have amounted to the qty of pills)   I did explain to the pt to rotate tylenol and ibuprofen and he said he would try that but wanted the rest of his prescription in case he needed especially at night.

## 2019-10-28 ENCOUNTER — Ambulatory Visit (INDEPENDENT_AMBULATORY_CARE_PROVIDER_SITE_OTHER): Payer: Self-pay | Admitting: General Surgery

## 2019-10-28 ENCOUNTER — Other Ambulatory Visit: Payer: Self-pay

## 2019-10-28 ENCOUNTER — Encounter: Payer: Self-pay | Admitting: General Surgery

## 2019-10-28 VITALS — BP 132/86 | HR 68 | Temp 98.4°F | Resp 12 | Ht 73.0 in | Wt 187.0 lb

## 2019-10-28 DIAGNOSIS — Z09 Encounter for follow-up examination after completed treatment for conditions other than malignant neoplasm: Secondary | ICD-10-CM

## 2019-10-28 NOTE — Progress Notes (Signed)
Subjective:     Joseph Young  Here for postoperative visit, status post right inguinal herniorrhaphy with mesh.  Patient still having moderate incisional pain.  He is trying to progressively increase his activity. Objective:    BP 132/86   Pulse 68   Temp 98.4 F (36.9 C) (Oral)   Resp 12   Ht 6\' 1"  (1.854 m)   Wt 187 lb (84.8 kg)   SpO2 95%   BMI 24.67 kg/m   General:  alert, cooperative and no distress  Right inguinal incision healing well.     Assessment:    Doing well postoperatively.    Plan:   Continue increase activity as able.  Still avoid any significant heavy lifting over 30 pounds.  We will see again in the office in 2 weeks to assess return to work.

## 2019-10-30 ENCOUNTER — Telehealth: Payer: Self-pay

## 2019-10-30 NOTE — Telephone Encounter (Signed)
Last office note and work note faxed to [employer] [phone].

## 2019-11-11 ENCOUNTER — Ambulatory Visit (INDEPENDENT_AMBULATORY_CARE_PROVIDER_SITE_OTHER): Payer: Self-pay | Admitting: General Surgery

## 2019-11-11 ENCOUNTER — Encounter: Payer: Self-pay | Admitting: General Surgery

## 2019-11-11 ENCOUNTER — Other Ambulatory Visit: Payer: Self-pay

## 2019-11-11 ENCOUNTER — Other Ambulatory Visit: Payer: Self-pay | Admitting: Family Medicine

## 2019-11-11 VITALS — BP 136/82 | HR 70 | Temp 99.0°F | Resp 14 | Ht 73.0 in | Wt 190.0 lb

## 2019-11-11 DIAGNOSIS — K4090 Unilateral inguinal hernia, without obstruction or gangrene, not specified as recurrent: Secondary | ICD-10-CM

## 2019-11-11 DIAGNOSIS — Z09 Encounter for follow-up examination after completed treatment for conditions other than malignant neoplasm: Secondary | ICD-10-CM

## 2019-11-11 NOTE — Progress Notes (Signed)
Subjective:     Joseph Young  Here for postoperative visit.  States he had swelling in the right groin region recently.  It did go away.  He denies any nausea or vomiting. Objective:    BP 136/82   Pulse 70   Temp 99 F (37.2 C) (Oral)   Resp 14   Ht 6\' 1"  (1.854 m)   Wt 190 lb (86.2 kg)   SpO2 96%   BMI 25.07 kg/m   General:  alert, cooperative and no distress  Abdomen soft.  Incision healing well.  I could not reproduce her hernia or swelling in the right groin region while lying down or standing.     Assessment:    Doing well postoperatively.    Plan:   I reassured the patient that this was probably a little fluid collection from the abdomen which resolves with time.  He understands this and agrees.  He will return should he have any difficulties.  He may return to work without restrictions on 11/24/2019.

## 2019-11-22 ENCOUNTER — Other Ambulatory Visit: Payer: Self-pay

## 2019-11-22 ENCOUNTER — Encounter (HOSPITAL_COMMUNITY): Payer: Self-pay | Admitting: Emergency Medicine

## 2019-11-22 ENCOUNTER — Emergency Department (HOSPITAL_COMMUNITY)
Admission: EM | Admit: 2019-11-22 | Discharge: 2019-11-22 | Disposition: A | Discharge status: Home / Self Care | Payer: Self-pay | Attending: Emergency Medicine | Admitting: Emergency Medicine

## 2019-11-22 DIAGNOSIS — Z4800 Encounter for change or removal of nonsurgical wound dressing: Secondary | ICD-10-CM | POA: Insufficient documentation

## 2019-11-22 DIAGNOSIS — Z7982 Long term (current) use of aspirin: Secondary | ICD-10-CM | POA: Insufficient documentation

## 2019-11-22 DIAGNOSIS — F17210 Nicotine dependence, cigarettes, uncomplicated: Secondary | ICD-10-CM | POA: Insufficient documentation

## 2019-11-22 DIAGNOSIS — Z5189 Encounter for other specified aftercare: Secondary | ICD-10-CM

## 2019-11-22 NOTE — ED Triage Notes (Signed)
Patient had surgery for inguinal hernia 2 months ago by Dr Arnoldo Morale. Patient states that a small area at incision site had some intermittent swelling in which he informed Dr Arnoldo Morale but there was no concern. Patient states "area busted opened yesterday and drained." Denies any odor or fevers. No active drainage now.

## 2019-11-22 NOTE — ED Provider Notes (Signed)
[facility] EMERGENCY DEPARTMENT Provider Note   CSN: [id number] Arrival date & time: 11/22/19  1033     History Chief Complaint  Patient presents with  . Post-op Problem    Joseph Young is a 44 y.o. male.  Pt reports he had hernia surgery 6 weeks ago.  Pt reports an area has felt lumpy and today he noticed blood at the site.  Pt has a picture.  Pt concerned incision is coming apart, no other complaints.  No fever no chills no current bleeding  The history is provided by the patient. No language interpreter was used.       History reviewed. No pertinent past medical history.  Patient Active Problem List   Diagnosis Date Noted  . Right inguinal hernia   . Cervical sprain 11/10/2013  . Sprain, thumb 11/10/2013  . Elbow contusion 11/03/2013    Past Surgical History:  Procedure Laterality Date  . INGUINAL HERNIA REPAIR Right 10/10/2019   Procedure: HERNIA REPAIR INGUINAL ADULT;  Surgeon: Aviva Signs, MD;  Location: [facility];  Service: General;  Laterality: Right;       Family History  Problem Relation Age of Onset  . Hypertension Mother   . Hypertension Other     Social History   Tobacco Use  . Smoking status: Current Every Day Smoker    Packs/day: 0.50    Years: 18.00    Pack years: 9.00    Types: Cigarettes  . Smokeless tobacco: Never Used  Vaping Use  . Vaping Use: Never used  Substance Use Topics  . Alcohol use: Yes    Alcohol/week: 6.0 standard drinks    Types: 6 Cans of beer per week  . Drug use: No    Home Medications Prior to Admission medications   Medication Sig Start Date End Date Taking? Authorizing Provider  Aspirin-Caffeine (BACK & BODY EXTRA STRENGTH) 500-32.5 MG TABS Take 1 Package by mouth 2 (two) times daily as needed (pain.). [employer]'s Back & Body Pain Relief (Aspirin 500 mg/Acetaminophen 325 mg)    [provider]    Allergies    Patient has no known allergies.  Review of Systems   Review of Systems  All other systems  reviewed and are negative.   Physical Exam Updated Vital Signs BP (!) 141/101 (BP Location: Right Arm)   Pulse 68   Temp 98.6 F (37 C) (Oral)   Resp 18   Ht 6' (1.829 m)   Wt 74.8 kg   SpO2 100%   BMI 22.38 kg/m   Physical Exam Vitals and nursing note reviewed.  Constitutional:      Appearance: He is well-developed.  HENT:     Head: Normocephalic and atraumatic.  Cardiovascular:     Rate and Rhythm: Normal rate.     Heart sounds: No murmur heard.   Pulmonary:     Effort: Pulmonary effort is normal. No respiratory distress.  Abdominal:     General: Abdomen is flat.     Palpations: Abdomen is soft.     Tenderness: There is no abdominal tenderness.     Comments: Healed incision,  Small scabbed area, appear superficial   Musculoskeletal:     Cervical back: Neck supple.  Skin:    General: Skin is warm and dry.  Neurological:     General: No focal deficit present.     Mental Status: He is alert.  Psychiatric:        Mood and Affect: Mood normal.  ED Results / Procedures / Treatments   Labs (all labs ordered are listed, but only abnormal results are displayed) Labs Reviewed - No data to display  EKG None  Radiology No results found.  Procedures Procedures (including critical care time)  Medications Ordered in ED Medications - No data to display  ED Course  I have reviewed the triage vital signs and the nursing notes.  Pertinent labs & imaging results that were available during my care of the patient were reviewed by me and considered in my medical decision making (see chart for details).    MDM Rules/Calculators/A&P                          Pt's pictures reviewed,  Small area of irritaion,  No deep opening Final Clinical Impression(s) / ED Diagnoses Final diagnoses:  Visit for wound check    Rx / DC Orders ED Discharge Orders    None    An After Visit Summary was printed and given to the patient.    Fransico Meadow, Hershal Coria 11/22/19  1618    Lajean Saver, MD 11/23/19 [phone]

## 2019-11-22 NOTE — Discharge Instructions (Addendum)
Follow up with Dr. Arnoldo Morale if any problems.

## 2019-12-04 ENCOUNTER — Ambulatory Visit (INDEPENDENT_AMBULATORY_CARE_PROVIDER_SITE_OTHER): Payer: Self-pay | Admitting: General Surgery

## 2019-12-04 ENCOUNTER — Other Ambulatory Visit: Payer: Self-pay

## 2019-12-04 ENCOUNTER — Encounter: Payer: Self-pay | Admitting: General Surgery

## 2019-12-04 VITALS — BP 125/88 | HR 69 | Temp 98.5°F | Resp 16 | Ht 73.0 in | Wt 195.0 lb

## 2019-12-04 DIAGNOSIS — Z09 Encounter for follow-up examination after completed treatment for conditions other than malignant neoplasm: Secondary | ICD-10-CM

## 2019-12-04 NOTE — Progress Notes (Signed)
Subjective:     Joseph Young  Here for wound check.  Was seen in the emergency room for slight dehiscence of the incision.  It has since closed.  He still has intermittent numbness in the surgical region. Objective:    BP 125/88   Pulse 69   Temp 98.5 F (36.9 C) (Oral)   Resp 16   Ht 6\' 1"  (1.854 m)   Wt 195 lb (88.5 kg)   SpO2 98%   BMI 25.73 kg/m   General:  alert, cooperative and no distress  Right inguinal incision has healed.     Assessment:    Doing well postoperatively.    Plan:   I told him that the numbness that he is experiencing will resolve with time.  He may always have some residual superficial numbness, but this will not affect performance.  Follow-up as needed.

## 2019-12-17 ENCOUNTER — Encounter: Payer: Self-pay | Admitting: Family Medicine

## 2020-06-19 ENCOUNTER — Other Ambulatory Visit: Payer: Self-pay

## 2020-06-19 ENCOUNTER — Emergency Department (HOSPITAL_COMMUNITY)
Admission: EM | Admit: 2020-06-19 | Discharge: 2020-06-19 | Disposition: A | Discharge status: Home / Self Care | Payer: BLUE CROSS/BLUE SHIELD | Attending: Emergency Medicine | Admitting: Emergency Medicine

## 2020-06-19 ENCOUNTER — Encounter (HOSPITAL_COMMUNITY): Payer: Self-pay | Admitting: Emergency Medicine

## 2020-06-19 DIAGNOSIS — R1031 Right lower quadrant pain: Secondary | ICD-10-CM | POA: Insufficient documentation

## 2020-06-19 DIAGNOSIS — F17210 Nicotine dependence, cigarettes, uncomplicated: Secondary | ICD-10-CM | POA: Insufficient documentation

## 2020-06-19 MED ORDER — NAPROXEN 500 MG PO TABS
ORAL_TABLET | ORAL | 1 refills | Status: DC
Start: 2020-06-19 — End: 2020-07-26

## 2020-06-19 NOTE — ED Provider Notes (Signed)
[facility] EMERGENCY DEPARTMENT Provider Note   CSN: [id number] Arrival date & time: 06/19/20  1153     History Chief Complaint  Patient presents with  . Inguinal Hernia    Joseph Young is a 45 y.o. male.  Patient complains of pain in his right inguinal area.  He had a hernia repair in the last year and states it has been hurting off and on since  The history is provided by the patient and a significant other. No language interpreter was used.  Abdominal Pain Pain location:  RLQ Pain quality: aching   Pain radiates to:  Does not radiate Pain severity:  Mild Onset quality:  Gradual Timing:  Intermittent Progression:  Waxing and waning Chronicity:  Recurrent Context: not alcohol use   Associated symptoms: no chest pain, no cough, no diarrhea, no fatigue and no hematuria        History reviewed. No pertinent past medical history.  Patient Active Problem List   Diagnosis Date Noted  . Right inguinal hernia   . Cervical sprain 11/10/2013  . Sprain, thumb 11/10/2013  . Elbow contusion 11/03/2013    Past Surgical History:  Procedure Laterality Date  . INGUINAL HERNIA REPAIR Right 10/10/2019   Procedure: HERNIA REPAIR INGUINAL ADULT;  Surgeon: Aviva Signs, MD;  Location: [facility];  Service: General;  Laterality: Right;       Family History  Problem Relation Age of Onset  . Hypertension Mother   . Hypertension Other     Social History   Tobacco Use  . Smoking status: Current Every Day Smoker    Packs/day: 0.50    Years: 18.00    Pack years: 9.00    Types: Cigarettes  . Smokeless tobacco: Never Used  Vaping Use  . Vaping Use: Never used  Substance Use Topics  . Alcohol use: Yes    Alcohol/week: 6.0 standard drinks    Types: 6 Cans of beer per week  . Drug use: No    Home Medications Prior to Admission medications   Medication Sig Start Date End Date Taking? Authorizing Provider  naproxen (NAPROSYN) 500 MG tablet Take one every 12 hours as needed  for pain 06/19/20  Yes Milton Ferguson, MD    Allergies    Patient has no known allergies.  Review of Systems   Review of Systems  Constitutional: Negative for appetite change and fatigue.  HENT: Negative for congestion, ear discharge and sinus pressure.   Eyes: Negative for discharge.  Respiratory: Negative for cough.   Cardiovascular: Negative for chest pain.  Gastrointestinal: Positive for abdominal pain. Negative for diarrhea.  Genitourinary: Negative for frequency and hematuria.  Musculoskeletal: Negative for back pain.  Skin: Negative for rash.  Neurological: Negative for seizures and headaches.  Psychiatric/Behavioral: Negative for hallucinations.    Physical Exam Updated Vital Signs BP (!) 152/98   Pulse 65   Temp 98.9 F (37.2 C) (Oral)   Resp 17   Ht 6' (1.829 m)   Wt 74.8 kg   SpO2 100%   BMI 22.38 kg/m   Physical Exam Vitals reviewed.  Constitutional:      Appearance: He is well-developed.  HENT:     Head: Normocephalic.     Nose: Nose normal.  Eyes:     General: No scleral icterus.    Conjunctiva/sclera: Conjunctivae normal.  Neck:     Thyroid: No thyromegaly.  Cardiovascular:     Rate and Rhythm: Normal rate and regular rhythm.  Heart sounds: No murmur heard. No friction rub. No gallop.   Pulmonary:     Breath sounds: No stridor. No wheezing or rales.  Chest:     Chest wall: No tenderness.  Abdominal:     General: There is no distension.     Tenderness: There is abdominal tenderness. There is no rebound.     Comments: Patient has minimal tenderness over his right inguinal area no obvious hernia felt  Musculoskeletal:        General: Normal range of motion.     Cervical back: Neck supple.  Lymphadenopathy:     Cervical: No cervical adenopathy.  Skin:    Findings: No erythema or rash.  Neurological:     Mental Status: He is alert and oriented to person, place, and time.     Motor: No abnormal muscle tone.     Coordination: Coordination  normal.  Psychiatric:        Behavior: Behavior normal.     ED Results / Procedures / Treatments   Labs (all labs ordered are listed, but only abnormal results are displayed) Labs Reviewed - No data to display  EKG None  Radiology No results found.  Procedures Procedures   Medications Ordered in ED Medications - No data to display  ED Course  I have reviewed the triage vital signs and the nursing notes.  Pertinent labs & imaging results that were available during my care of the patient were reviewed by me and considered in my medical decision making (see chart for details).    MDM Rules/Calculators/A&P                          Patient with pain to his right inguinal area.  He had a hernia repair there last year.  He is referred back to Dr. Arnoldo Morale the general surgeon and put on Naprosyn Final Clinical Impression(s) / ED Diagnoses Final diagnoses:  None    Rx / DC Orders ED Discharge Orders         Ordered    naproxen (NAPROSYN) 500 MG tablet        06/19/20 1244           Milton Ferguson, MD 06/19/20 1247

## 2020-06-19 NOTE — Discharge Instructions (Addendum)
Follow-up with Dr. Arnoldo Morale or one of his partners in the next couple weeks for recheck

## 2020-06-19 NOTE — ED Triage Notes (Signed)
Pt c/o right inguinal hernia pain x2 wks.  Pt had an operation a year ago with Dr. Arnoldo Morale.  Pt states when he walks its like a balloon blowing up and down.

## 2020-06-24 ENCOUNTER — Other Ambulatory Visit: Payer: Self-pay

## 2020-06-24 ENCOUNTER — Ambulatory Visit (INDEPENDENT_AMBULATORY_CARE_PROVIDER_SITE_OTHER): Payer: BLUE CROSS/BLUE SHIELD | Admitting: General Surgery

## 2020-06-24 ENCOUNTER — Encounter: Payer: Self-pay | Admitting: General Surgery

## 2020-06-24 VITALS — BP 121/83 | HR 72 | Temp 98.2°F | Resp 16 | Ht 73.0 in | Wt 192.0 lb

## 2020-06-24 DIAGNOSIS — R1031 Right lower quadrant pain: Secondary | ICD-10-CM | POA: Diagnosis not present

## 2020-06-24 MED ORDER — GABAPENTIN 300 MG PO CAPS: 300.0000 mg | ORAL_CAPSULE | Freq: Three times a day (TID) | ORAL | 1 refills | Status: DC

## 2020-06-24 NOTE — Progress Notes (Deleted)
Joseph Young; [id number]; 1975-09-20   HPI Patient is a 45 year old black male who presents to schedule a screening colonoscopy.  He has never had a colonoscopy.  He has had pruritus a night in the past which has been treated.  He denies any abnormal weight loss, diarrhea or constipation, or blood in in his stools except when he occasionally wipes himself and his anus is irritated.  There is no family history of colon cancer. History reviewed. No pertinent past medical history.  Past Surgical History:  Procedure Laterality Date  . INGUINAL HERNIA REPAIR Right 10/10/2019   Procedure: HERNIA REPAIR INGUINAL ADULT;  Surgeon: Aviva Signs, MD;  Location: [facility];  Service: General;  Laterality: Right;    Family History  Problem Relation Age of Onset  . Hypertension Mother   . Hypertension Other     Current Outpatient Medications on File Prior to Visit  Medication Sig Dispense Refill  . naproxen (NAPROSYN) 500 MG tablet Take one every 12 hours as needed for pain 30 tablet 1   No current facility-administered medications on file prior to visit.    No Known Allergies  Social History   Substance and Sexual Activity  Alcohol Use Yes  . Alcohol/week: 6.0 standard drinks  . Types: 6 Cans of beer per week    Social History   Tobacco Use  Smoking Status Current Every Day Smoker  . Packs/day: 0.50  . Years: 18.00  . Pack years: 9.00  . Types: Cigarettes  Smokeless Tobacco Never Used    Review of Systems  Constitutional: Negative.   HENT: Negative.   Eyes: Negative.   Respiratory: Negative.   Cardiovascular: Negative.   Gastrointestinal: Negative.   Genitourinary: Negative.   Musculoskeletal: Negative.   Skin: Negative.   Neurological: Negative.   Endo/Heme/Allergies: Negative.   Psychiatric/Behavioral: Negative.     Objective   Vitals:   06/24/20 0859  BP: 121/83  Pulse: 72  Resp: 16  Temp: 98.2 F (36.8 C)  SpO2: 97%    Physical Exam Vitals reviewed.   Constitutional:      Appearance: He is well-developed and normal weight. He is not ill-appearing.  HENT:     Head: Normocephalic and atraumatic.  Cardiovascular:     Rate and Rhythm: Normal rate and regular rhythm.     Heart sounds: No murmur heard. No friction rub. No gallop.   Pulmonary:     Effort: Pulmonary effort is normal. No respiratory distress.     Breath sounds: Normal breath sounds. No stridor. No wheezing, rhonchi or rales.  Abdominal:     General: Bowel sounds are normal.     Palpations: Abdomen is soft.     Tenderness: There is no abdominal tenderness.     Hernia: No hernia is present.  Skin:    General: Skin is warm and dry.  Neurological:     Mental Status: He is alert and oriented to person, place, and time.    Previous office notes reviewed Assessment  Need for screening colonoscopy Plan   Patient is scheduled for screening colonoscopy on 07/23/2020.  The risks and benefits of the procedure including bleeding and perforation were fully explained to the patient, who gave informed consent.  Sutabs have been prescribed for bowel preparation.

## 2020-06-24 NOTE — Patient Instructions (Signed)
Ibuprofen Oral Tablets and Capsules What is this medicine? IBUPROFEN (eye BYOO proe fen) is a non-steroidal anti-inflammatory drug, also known as an NSAID. It treats pain, inflammation, and swelling. It also reduces fever and minor aches and pains caused by the cold, flu, or a sore throat. This medicine may be used for other purposes; ask your health care provider or pharmacist if you have questions. COMMON BRAND NAME(S): Advil, Advil Junior Strength, Advil Migraine, Genpril, Ibren, IBU, Ibupak, Midol, Midol Cramps and Body Aches, Motrin, Motrin IB, Motrin Junior Strength, Motrin Migraine Pain, Samson-8, Toxicology Saliva Collection What should I tell my health care provider before I take this medicine? They need to know if you have any of these conditions:  bleeding disorder  coronary artery bypass graft (CABG) within the past 2 weeks  dehydration  diarrhea  heart attack  heart disease  heart failure  high blood pressure  if you often drink alcohol  kidney disease  liver disease  lung or breathing disease (asthma)  receiving steroids like dexamethasone or prednisone  smoke tobacco cigarettes  stomach bleeding  stomach ulcers, other stomach or intestine problems  stroke  take drugs that treat or prevent blood clots  vomiting  an unusual or allergic reaction to ibuprofen, other medicines, foods, dyes, or preservatives  pregnant or trying to get pregnant  breast-feeding How should I use this medicine? Take this drug by mouth. Take it as directed on the label. You can take it with or without food. If it upsets your stomach, take it with food. Talk to your health care provider about the use of this drug in children. While it may be prescribed for children as young as 12 for selected conditions, precautions do apply. Patients over 46 years of age may have a stronger reaction and need a smaller dose. If you get this drug as a prescription, a special MedGuide will be  given to you by the pharmacist with each prescription and refill. Be sure to read this information carefully each time. Overdosage: If you think you have taken too much of this medicine contact a poison control center or emergency room at once. NOTE: This medicine is only for you. Do not share this medicine with others. What if I miss a dose? If you take this drug on a regular basis, take it as soon as you can. If it is almost time for your next dose, take only that dose. Do not take double or extra doses. What may interact with this medicine? Do not take this medicine with any of the following medications:  cidofovir  ketorolac  methotrexate  pemetrexed This medicine may also interact with the following medications:  alcohol  aspirin  diuretics  lithium  other drugs for inflammation like prednisone  warfarin This list may not describe all possible interactions. Give your health care provider a list of all the medicines, herbs, non-prescription drugs, or dietary supplements you use. Also tell them if you smoke, drink alcohol, or use illegal drugs. Some items may interact with your medicine. What should I watch for while using this medicine? Visit your health care provider for regular checks on your progress. Tell your health care provider if your symptoms do not start to get better or if they get worse. A painful sore throat or sore throat with high fevers, headaches, nausea, or vomiting may be signs of a serious infection. Call your health care provider if these symptoms occur. Do not use this medicine for more than 2 days  or give to children under 25 years of age unless your health care provider tells you to. Do not take other medicines that contain aspirin, ibuprofen, or naproxen with this medicine. Side effects such as stomach upset, nausea, or ulcers may be more likely to occur. Many non-prescription medicines contain aspirin, ibuprofen, or naproxen. Always read labels  carefully. This medicine can cause serious ulcers and bleeding in the stomach. It can happen with no warning. Smoking, drinking alcohol, older age, and poor health can also increase risks. Call your health care provider right away if you have stomach pain or blood in your vomit or stool. This medicine does not prevent a heart attack or stroke. This medicine may increase the chance of a heart attack or stroke. The chance may increase the longer you use this medicine or if you have heart disease. If you take aspirin to prevent a heart attack or stroke, talk to your health care provider about using this medicine. Alcohol may interfere with the effect of this medicine. Avoid alcoholic drinks. This medicine may cause serious skin reactions. They can happen weeks to months after starting the medicine. Contact your health care provider right away if you notice fevers or flu-like symptoms with a rash. The rash may be red or purple and then turn into blisters or peeling of the skin. Or, you might notice a red rash with swelling of the face, lips or lymph nodes in your neck or under your arms. Talk to your health care provider if you are pregnant before taking this medicine. Taking this medicine between weeks 20 and 30 of pregnancy may harm your unborn baby. Your health care provider will monitor you closely if you need to take it. After 30 weeks of pregnancy, do not take this medicine. You may get drowsy or dizzy. Do not drive, use machinery, or do anything that needs mental alertness until you know how this medicine affects you. Do not stand up or sit up quickly, especially if you are an older patient. This reduces the risk of dizzy or fainting spells. Be careful brushing or flossing your teeth or using a toothpick because you may get an infection or bleed more easily. If you have any dental work done, tell your dentist you are receiving this medicine. This medicine may make it more difficult to get pregnant. Talk  to your health care provider if you are concerned about your fertility. What side effects may I notice from receiving this medicine? Side effects that you should report to your doctor or health care provider as soon as possible:  allergic reactions (skin rash, itching or hives; swelling of the face, lips, or tongue)  aseptic meningitis (stiff neck; sensitivity to light; headache; drowsiness; fever; nausea, vomiting; rash)  bleeding (bloody or black, tarry stools; red or dark brown urine; spitting up blood or brown material that looks like coffee grounds; red spots on the skin; unusual bruising or bleeding from the eyes, gums, or nose)  blurred vision OR changes in vision  heart attack (trouble breathing; pain or tightness in the chest, neck, back or arms; unusually weak or tired)  heart failure (trouble breathing; fast, irregular heartbeat; sudden weight gain; swelling of the ankles, feet, hands; unusually weak or tired)  high potassium levels (chest pain; fast, irregular heartbeat; muscle weakness)  increase in blood pressure  infection (fever, chills, cough, sore throat, pain or trouble passing urine)  kidney injury (trouble passing urine or change in the amount of urine)  liver injury (  dark yellow or brown urine; general ill feeling or flu-like symptoms; loss of appetite, right upper belly pain; unusually weak or tired, yellowing of the eyes or skin)  low blood pressure (dizziness; feeling faint or lightheaded, falls; unusually weak or tired)  low red blood cell counts (trouble breathing; feeling faint; lightheaded, falls; unusually weak or tired)  rash, fever, and swollen lymph nodes  redness, blistering, peeling, or loosening of the skin, including inside the mouth  stroke (changes in vision; confusion; trouble speaking or understanding; severe headaches; sudden numbness or weakness of the face, arm or leg; trouble walking; dizziness; loss of balance or coordination) Side  effects that usually do not require medical attention (report to your doctor or health care provider if they continue or are bothersome):  cough  constipation  diarrhea  dizziness  headache  upset stomach  vomiting This list may not describe all possible side effects. Call your doctor for medical advice about side effects. You may report side effects to FDA at 1-800-FDA-1088. Where should I keep my medicine? Keep out of the reach of children and pets. Store at room temperature between 20 and 25 degrees C (68 and 77 degrees F). Get rid of any unused medicine after the expiration date. To get rid of medicines that are no longer needed or have expired:  Take the medicine to a medicine take-back program. Check with your pharmacy or law enforcement to find a location.  If you cannot return the medicine, check the label or package insert to see if the medicine should be thrown out in the garbage or flushed down the toilet. If you are not sure, ask your health care provider. If it is safe to put it in the trash, empty the medicine out of the container. Mix the medicine with cat litter, dirt, coffee grounds, or other unwanted substance. Seal the mixture in a bag or container. Put it in the trash. NOTE: This sheet is a summary. It may not cover all possible information. If you have questions about this medicine, talk to your doctor, pharmacist, or health care provider.  2021 Elsevier/Gold Standard (2019-07-25 12:06:36)

## 2020-06-24 NOTE — Progress Notes (Signed)
Joseph Young; [id number]; [birth year]   HPI Patient presents from the emergency room for evaluation and treatment of right groin pain.  He is status post a right inguinal herniorrhaphy with mesh in July 2021.  He states that over the past few months, he has had point tenderness in the right groin region.  It seems to be localized to that area.  It does not radiate at the present time.  She does not specifically remember any particular trauma to that area.  It continues to be an intermittent burning sensation at the right groin.  The pain seems to wax and wane.  He is not working. History reviewed. No pertinent past medical history.  Past Surgical History:  Procedure Laterality Date  . INGUINAL HERNIA REPAIR Right 10/10/2019   Procedure: HERNIA REPAIR INGUINAL ADULT;  Surgeon: Aviva Signs, MD;  Location: [facility];  Service: General;  Laterality: Right;    Family History  Problem Relation Age of Onset  . Hypertension Mother   . Hypertension Other     Current Outpatient Medications on File Prior to Visit  Medication Sig Dispense Refill  . naproxen (NAPROSYN) 500 MG tablet Take one every 12 hours as needed for pain 30 tablet 1   No current facility-administered medications on file prior to visit.    No Known Allergies  Social History   Substance and Sexual Activity  Alcohol Use Yes  . Alcohol/week: 6.0 standard drinks  . Types: 6 Cans of beer per week    Social History   Tobacco Use  Smoking Status Current Every Day Smoker  . Packs/day: 0.50  . Years: 18.00  . Pack years: 9.00  . Types: Cigarettes  Smokeless Tobacco Never Used    Review of Systems  Constitutional: Negative.   HENT: Negative.   Eyes: Negative.   Respiratory: Negative.   Cardiovascular: Negative.   Gastrointestinal: Negative.   Genitourinary: Negative.   Musculoskeletal: Negative.   Skin: Negative.   Neurological: Negative.   Endo/Heme/Allergies: Negative.   Psychiatric/Behavioral: Negative.      Objective   Vitals:   06/24/20 0859  BP: 121/83  Pulse: 72  Resp: 16  Temp: 98.2 F (36.8 C)  SpO2: 97%    Physical Exam Vitals reviewed.  Constitutional:      Appearance: He is well-developed and normal weight. He is not ill-appearing.  HENT:     Head: Normocephalic and atraumatic.  Cardiovascular:     Rate and Rhythm: Normal rate and regular rhythm.     Heart sounds: No murmur heard. No friction rub. No gallop.   Pulmonary:     Effort: Pulmonary effort is normal. No respiratory distress.     Breath sounds: Normal breath sounds. No stridor. No wheezing, rhonchi or rales.  Abdominal:     General: Abdomen is flat. Bowel sounds are normal.     Palpations: Abdomen is soft.     Tenderness: There is abdominal tenderness.     Hernia: No hernia is present.     Comments: He does have some point tenderness right over the right suprapubic tubercle.  The incision line is intact.  I do not palpate a recurrent hernia.  He states the pain is along the right groin region.  Skin:    General: Skin is warm and dry.  Neurological:     Mental Status: He is alert and oriented to person, place, and time.   Previous operative note reviewed Assessment  Right groin pain, question post surgical neuralgia.  No  apparent recurrent hernia on physical examination. Plan   Patient has been prescribed Neurontin 300 mg p.o. 3 times daily.  He also should take ibuprofen for pain.  He should avoid any significant activity that triggers the pain.  We will see him back in 1 month for follow-up.

## 2020-07-15 ENCOUNTER — Other Ambulatory Visit: Payer: Self-pay

## 2020-07-15 ENCOUNTER — Ambulatory Visit (INDEPENDENT_AMBULATORY_CARE_PROVIDER_SITE_OTHER): Payer: BLUE CROSS/BLUE SHIELD | Admitting: General Surgery

## 2020-07-15 ENCOUNTER — Encounter: Payer: Self-pay | Admitting: General Surgery

## 2020-07-15 VITALS — BP 127/82 | HR 70 | Temp 98.1°F | Resp 12 | Ht 73.0 in | Wt 189.0 lb

## 2020-07-15 DIAGNOSIS — K4091 Unilateral inguinal hernia, without obstruction or gangrene, recurrent: Secondary | ICD-10-CM

## 2020-07-15 NOTE — H&P (Signed)
Joseph Young is an 45 y.o. male.   Chief Complaint: Right groin pain, recurrent right inguinal hernia HPI: Patient is a 45 year old black male status post right inguinal herniorrhaphy in July 2021 who presents with ongoing right groin pain and a possible recurrent right inguinal hernia.  He has failed conservative management.  The pain is made worse with straining.  The pain radiates to the right testicle.  No past medical history on file.  Past Surgical History:  Procedure Laterality Date  . INGUINAL HERNIA REPAIR Right 10/10/2019   Procedure: HERNIA REPAIR INGUINAL ADULT;  Surgeon: Aviva Signs, MD;  Location: [facility];  Service: General;  Laterality: Right;    Family History  Problem Relation Age of Onset  . Hypertension Mother   . Hypertension Other    Social History:  reports that he has been smoking cigarettes. He has a 9.00 pack-year smoking history. He has never used smokeless tobacco. He reports current alcohol use of about 6.0 standard drinks of alcohol per week. He reports that he does not use drugs.  Allergies: No Known Allergies  No medications prior to admission.    No results found for this or any previous visit (from the past 48 hour(s)). No results found.  Review of Systems  Constitutional: Negative.   HENT: Negative.   Eyes: Negative.   Respiratory: Negative.   Cardiovascular: Negative.   Gastrointestinal: Positive for abdominal pain.  Endocrine: Negative.   Genitourinary: Negative.   Musculoskeletal: Negative.   Allergic/Immunologic: Negative.   Neurological: Negative.   Hematological: Negative.   Psychiatric/Behavioral: Negative.     There were no vitals taken for this visit. Physical Exam Vitals reviewed.  Constitutional:      Appearance: Normal appearance. He is normal weight. He is not ill-appearing.  HENT:     Head: Normocephalic and atraumatic.  Cardiovascular:     Rate and Rhythm: Normal rate and regular rhythm.     Heart sounds: Normal  heart sounds. No murmur heard. No friction rub. No gallop.   Pulmonary:     Effort: Pulmonary effort is normal. No respiratory distress.     Breath sounds: Normal breath sounds. No stridor. No wheezing, rhonchi or rales.  Abdominal:     General: Bowel sounds are normal. There is no distension.     Palpations: Abdomen is soft. There is no mass.     Tenderness: There is abdominal tenderness. There is no guarding or rebound.     Hernia: A hernia is present.     Comments: Point tenderness over the right inguinal internal ring.  A possible small right recurrent hernia is noted at the internal ring.  No incarceration noted.  Skin:    General: Skin is warm and dry.  Neurological:     Mental Status: He is alert and oriented to person, place, and time.      Assessment/Plan Impression: Persistent right groin pain, possible recurrent right inguinal hernia  Plan: Patient is scheduled for a recurrent right inguinal herniorrhaphy with mesh and possible neurolysis on 07/26/2020.  The risks and benefits of the procedure including bleeding, infection, pain, numbness, and the possibility of recurrence of the hernia were fully explained to the patient, who gave informed consent.  Aviva Signs, MD 07/15/2020, 10:05 AM

## 2020-07-15 NOTE — Progress Notes (Signed)
Subjective:     Joseph Young  Patient presents for follow-up of right groin pain.  He states he is still having the pain and the Neurontin and nonsteroidal anti-inflammatory drug has not been helpful.  He feels like a ballooning sensation when coughing.  The pain radiates down to the right testicle.  Is made worse with straining. Objective:    BP 127/82   Pulse 70   Temp 98.1 F (36.7 C) (Other (Comment))   Resp 12   Ht 6\' 1"  (1.854 m)   Wt 189 lb (85.7 kg)   SpO2 97%   BMI 24.94 kg/m   General:  alert, cooperative and no distress  On examination of the right groin, I now feel a possible swelling at the internal ring.  He does have point tenderness at the internal ring.     Assessment:    Persistent right groin pain, now with possible recurrent right inguinal hernia    Plan:   As patient has failed conservative management, will proceed with a recurrent right inguinal herniorrhaphy with mesh and possible neurolysis on 07/26/2020.  The risks and benefits of the procedure including bleeding, infection, pain, numbness, and the possibility of recurrence of the hernia were fully explained to the patient, who gave informed consent.

## 2020-07-19 NOTE — Patient Instructions (Signed)
Joseph Young  07/19/2020     @PREFPERIOPPHARMACY @   Your procedure is scheduled on  07/26/2020.   Report to [name] at  0700  A.M.   Call this number if you have problems the morning of surgery:  [phone]   Remember:  Do not eat or drink after midnight.                      Take these medicines the morning of surgery with A SIP OF WATER  Gabapentin   Place clean sheets on your bed the night before your procedure and DO NOT sleep with pets this night.  Shower with CHG the night before and the morning of your procedure. DO NOT use CHG on your face, hair or genitals.  After each shower, dry off with a clean towel, put on clean, comfortable clothes and brush your teeth.      Do not wear jewelry, make-up or nail polish.  Do not wear lotions, powders, or perfumes, or deodorant.  Do not shave 48 hours prior to surgery.  Men may shave face and neck.  Do not bring valuables to the hospital.  [facility] is not responsible for any belongings or valuables.  Contacts, dentures or bridgework may not be worn into surgery.  Leave your suitcase in the car.  After surgery it may be brought to your room.  For patients admitted to the hospital, discharge time will be determined by your treatment team.  Patients discharged the day of surgery will not be allowed to drive home and must  Have someone with them for 24 hours.   Special instructions:  DO NOT smoke tobacco or vape for 24 hours before your procedure.  Please read over the following fact sheets that you were given. Coughing and Deep Breathing, Surgical Site Infection Prevention, Anesthesia Post-op Instructions and Care and Recovery After Surgery       Open Hernia Repair, Adult, Care After What can I expect after the procedure? After the procedure, it is common to have:  Mild discomfort.  Slight bruising.  Mild swelling.  Pain in the belly (abdomen).  A small amount of blood from the cut from surgery  (incision). Follow these instructions at home: Your doctor may give you more specific instructions. If you have problems, call your doctor. Medicines  Take over-the-counter and prescription medicines only as told by your doctor.  If told, take steps to prevent problems with pooping (constipation). You may need to: ? Drink enough fluid to keep your pee (urine) pale yellow. ? Take medicines. You will be told what medicines to take. ? Eat foods that are high in fiber. These include beans, whole grains, and fresh fruits and vegetables. ? Limit foods that are high in fat and sugar. These include fried or sweet foods.  Ask your doctor if you should avoid driving or using machines while you are taking your medicine. Incision care  Follow instructions from your doctor about how to take care of your incision. Make sure you: ? Wash your hands with soap and water for at least 20 seconds before and after you change your bandage (dressing). If you cannot use soap and water, use hand sanitizer. ? Change your bandage. ? Leave stitches or skin glue in place for at least 2 weeks. ? Leave tape strips alone unless you are told to take them off. You may trim the edges of the tape strips if they  curl up.  Check your incision every day for signs of infection. Check for: ? More redness, swelling, or pain. ? More fluid or blood. ? Warmth. ? Pus or a bad smell.  Wear loose, soft clothing while your incision heals.   Activity  Rest as told by your doctor.  Do not lift anything that is heavier than 10 lb (4.5 kg), or the limit that you are told.  Do not play contact sports until your doctor says that this is safe.  If you were given a sedative during your procedure, do not drive or use machines until your doctor says that it is safe. A sedative is a medicine that helps you relax.  Return to your normal activities when your doctor says that it is safe.   General instructions  Do not take baths, swim,  or use a hot tub. Ask your doctor about taking showers or sponge baths.  Hold a pillow over your belly when you cough or sneeze. This helps with pain.  Do not smoke or use any products that contain nicotine or tobacco. If you need help quitting, ask your doctor.  Keep all follow-up visits. Contact a doctor if:  You have any of these signs of infection in or around your incision: ? More redness, swelling, or pain. ? More fluid or blood. ? Warmth. ? Pus. ? A bad smell.  You have a fever or chills.  You have blood in your poop (stool).  You have not pooped (had a bowel movement) in 2-3 days.  Medicine does not help your pain. Get help right away if:  You have chest pain, or you are short of breath.  You feel faint or light-headed.  You have very bad pain.  You vomit and your pain is worse.  You have pain, swelling, or redness in a leg. These symptoms may be an emergency. Get help right away. Call your local emergency services (911 in the [place]  Do not wait to see if the symptoms will go away.  Do not drive yourself to the hospital. Summary  After this procedure, it is common to have mild discomfort, slight bruising, and mild swelling.  Follow instructions from your doctor about how to take care of your cut from surgery (incision). Check every day for signs of infection.  Do not lift heavy objects or play contact sports until your doctor says it is safe.  Return to your normal activities as told by your doctor. This information is not intended to replace advice given to you by your health care provider. Make sure you discuss any questions you have with your health care provider. Document Revised: 10/13/2019 Document Reviewed: 10/13/2019 Elsevier Patient Education  2021 [employer] Anesthesia, Adult, Care After This sheet gives you information about how to care for yourself after your procedure. Your health care provider may also give you more specific  instructions. If you have problems or questions, contact your health care provider. What can I expect after the procedure? After the procedure, the following side effects are common:  Pain or discomfort at the IV site.  Nausea.  Vomiting.  Sore throat.  Trouble concentrating.  Feeling cold or chills.  Feeling weak or tired.  Sleepiness and fatigue.  Soreness and body aches. These side effects can affect parts of the body that were not involved in surgery. Follow these instructions at home: For the time period you were told by your health care provider:  Rest.  Do not participate  in activities where you could fall or become injured.  Do not drive or use machinery.  Do not drink alcohol.  Do not take sleeping pills or medicines that cause drowsiness.  Do not make important decisions or sign legal documents.  Do not take care of children on your own.   Eating and drinking  Follow any instructions from your health care provider about eating or drinking restrictions.  When you feel hungry, start by eating small amounts of foods that are soft and easy to digest (bland), such as toast. Gradually return to your regular diet.  Drink enough fluid to keep your urine pale yellow.  If you vomit, rehydrate by drinking water, juice, or clear broth. General instructions  If you have sleep apnea, surgery and certain medicines can increase your risk for breathing problems. Follow instructions from your health care provider about wearing your sleep device: ? Anytime you are sleeping, including during daytime naps. ? While taking prescription pain medicines, sleeping medicines, or medicines that make you drowsy.  Have a responsible adult stay with you for the time you are told. It is important to have someone help care for you until you are awake and alert.  Return to your normal activities as told by your health care provider. Ask your health care provider what activities are safe  for you.  Take over-the-counter and prescription medicines only as told by your health care provider.  If you smoke, do not smoke without supervision.  Keep all follow-up visits as told by your health care provider. This is important. Contact a health care provider if:  You have nausea or vomiting that does not get better with medicine.  You cannot eat or drink without vomiting.  You have pain that does not get better with medicine.  You are unable to pass urine.  You develop a skin rash.  You have a fever.  You have redness around your IV site that gets worse. Get help right away if:  You have difficulty breathing.  You have chest pain.  You have blood in your urine or stool, or you vomit blood. Summary  After the procedure, it is common to have a sore throat or nausea. It is also common to feel tired.  Have a responsible adult stay with you for the time you are told. It is important to have someone help care for you until you are awake and alert.  When you feel hungry, start by eating small amounts of foods that are soft and easy to digest (bland), such as toast. Gradually return to your regular diet.  Drink enough fluid to keep your urine pale yellow.  Return to your normal activities as told by your health care provider. Ask your health care provider what activities are safe for you. This information is not intended to replace advice given to you by your health care provider. Make sure you discuss any questions you have with your health care provider. Document Revised: 11/13/2019 Document Reviewed: 06/12/2019 Elsevier Patient Education  2021 [employer].

## 2020-07-22 ENCOUNTER — Encounter (HOSPITAL_COMMUNITY): Payer: Self-pay

## 2020-07-22 ENCOUNTER — Other Ambulatory Visit: Payer: Self-pay

## 2020-07-22 ENCOUNTER — Other Ambulatory Visit (HOSPITAL_COMMUNITY)
Admission: RE | Admit: 2020-07-22 | Discharge: 2020-07-22 | Disposition: A | Discharge status: Home / Self Care | Payer: BLUE CROSS/BLUE SHIELD | Source: Ambulatory Visit | Attending: General Surgery | Admitting: General Surgery

## 2020-07-22 ENCOUNTER — Encounter (HOSPITAL_COMMUNITY)
Admission: RE | Admit: 2020-07-22 | Discharge: 2020-07-22 | Disposition: A | Discharge status: Home / Self Care | Payer: Self-pay | Source: Ambulatory Visit | Attending: General Surgery | Admitting: General Surgery

## 2020-07-22 DIAGNOSIS — Z20822 Contact with and (suspected) exposure to covid-19: Secondary | ICD-10-CM | POA: Insufficient documentation

## 2020-07-22 DIAGNOSIS — Z01818 Encounter for other preprocedural examination: Secondary | ICD-10-CM | POA: Insufficient documentation

## 2020-07-22 LAB — CBC WITH DIFFERENTIAL/PLATELET
Abs Immature Granulocytes: 0.02 10*3/uL (ref 0.00–0.07)
Basophils Absolute: 0 10*3/uL (ref 0.0–0.1)
Basophils Relative: 1 %
Eosinophils Absolute: 0 10*3/uL (ref 0.0–0.5)
Eosinophils Relative: 0 %
HCT: 45.7 % (ref 39.0–52.0)
Hemoglobin: 15.3 g/dL (ref 13.0–17.0)
Immature Granulocytes: 0 %
Lymphocytes Relative: 50 %
Lymphs Abs: 2.4 10*3/uL (ref 0.7–4.0)
MCH: 33 pg (ref 26.0–34.0)
MCHC: 33.5 g/dL (ref 30.0–36.0)
MCV: 98.7 fL (ref 80.0–100.0)
Monocytes Absolute: 0.5 10*3/uL (ref 0.1–1.0)
Monocytes Relative: 10 %
Neutro Abs: 1.9 10*3/uL (ref 1.7–7.7)
Neutrophils Relative %: 39 %
Platelets: 256 10*3/uL (ref 150–400)
RBC: 4.63 MIL/uL (ref 4.22–5.81)
RDW: 13.9 % (ref 11.5–15.5)
WBC: 4.9 10*3/uL (ref 4.0–10.5)
nRBC: 0 % (ref 0.0–0.2)

## 2020-07-22 LAB — SARS CORONAVIRUS 2 (TAT 6-24 HRS): SARS Coronavirus 2: NEGATIVE

## 2020-07-22 LAB — COMPREHENSIVE METABOLIC PANEL
ALT: 67 U/L — ABNORMAL HIGH (ref 0–44)
AST: 44 U/L — ABNORMAL HIGH (ref 15–41)
Albumin: 4.5 g/dL (ref 3.5–5.0)
Alkaline Phosphatase: 69 U/L (ref 38–126)
Anion gap: 9 (ref 5–15)
BUN: 11 mg/dL (ref 6–20)
CO2: 25 mmol/L (ref 22–32)
Calcium: 9.2 mg/dL (ref 8.9–10.3)
Chloride: 103 mmol/L (ref 98–111)
Creatinine, Ser: 1.11 mg/dL (ref 0.61–1.24)
GFR, Estimated: >60 mL/min (ref >60)
Glucose, Bld: 141 mg/dL — ABNORMAL HIGH (ref 70–99)
Potassium: 4.3 mmol/L (ref 3.5–5.1)
Sodium: 137 mmol/L (ref 135–145)
Total Bilirubin: 0.7 mg/dL (ref 0.3–1.2)
Total Protein: 8 g/dL (ref 6.5–8.1)

## 2020-07-26 ENCOUNTER — Ambulatory Visit (HOSPITAL_COMMUNITY): Payer: Self-pay | Admitting: Anesthesiology

## 2020-07-26 ENCOUNTER — Ambulatory Visit (HOSPITAL_COMMUNITY)
Admission: RE | Admit: 2020-07-26 | Discharge: 2020-07-26 | Disposition: A | Discharge status: Home / Self Care | Payer: Self-pay | Attending: General Surgery | Admitting: General Surgery

## 2020-07-26 ENCOUNTER — Encounter (HOSPITAL_COMMUNITY)
Admission: RE | Disposition: A | Discharge status: Home / Self Care | Payer: Self-pay | Source: Home / Self Care | Attending: General Surgery

## 2020-07-26 ENCOUNTER — Other Ambulatory Visit: Payer: Self-pay

## 2020-07-26 ENCOUNTER — Encounter (HOSPITAL_COMMUNITY): Payer: Self-pay | Admitting: General Surgery

## 2020-07-26 DIAGNOSIS — R1031 Right lower quadrant pain: Secondary | ICD-10-CM

## 2020-07-26 DIAGNOSIS — F17210 Nicotine dependence, cigarettes, uncomplicated: Secondary | ICD-10-CM | POA: Insufficient documentation

## 2020-07-26 DIAGNOSIS — D3616 Benign neoplasm of peripheral nerves and autonomic nervous system of pelvis: Secondary | ICD-10-CM | POA: Insufficient documentation

## 2020-07-26 HISTORY — PX: GROIN DISSECTION: SHX5250

## 2020-07-26 SURGERY — EXPLORATION, INGUINAL REGION
Anesthesia: General | Laterality: Right

## 2020-07-26 MED ORDER — SODIUM CHLORIDE 0.9 % IR SOLN
Status: DC | PRN
  Administered 2020-07-26: 1000 mL

## 2020-07-26 MED ORDER — CHLORHEXIDINE GLUCONATE CLOTH 2 % EX PADS
6.0000 | MEDICATED_PAD | Freq: Once | CUTANEOUS | Status: AC
  Administered 2020-07-26: 6 via TOPICAL

## 2020-07-26 MED ORDER — OXYCODONE-ACETAMINOPHEN 5-325 MG PO TABS: 1.0000 | ORAL_TABLET | ORAL | 0 refills | Status: DC | PRN

## 2020-07-26 MED ORDER — FENTANYL CITRATE (PF) 100 MCG/2ML IJ SOLN
25.0000 ug | INTRAMUSCULAR | Status: DC | PRN
  Administered 2020-07-26: 50 ug via INTRAVENOUS
  Filled 2020-07-26: qty 2

## 2020-07-26 MED ORDER — FENTANYL CITRATE (PF) 100 MCG/2ML IJ SOLN
INTRAMUSCULAR | Status: AC
  Filled 2020-07-26: qty 2

## 2020-07-26 MED ORDER — BUPIVACAINE LIPOSOME 1.3 % IJ SUSP
INTRAMUSCULAR | Status: DC | PRN
  Administered 2020-07-26: 5 mL
  Administered 2020-07-26: 15 mL

## 2020-07-26 MED ORDER — CEFAZOLIN SODIUM-DEXTROSE 2-4 GM/100ML-% IV SOLN
2.0000 g | INTRAVENOUS | Status: AC
  Administered 2020-07-26: 2 g via INTRAVENOUS

## 2020-07-26 MED ORDER — MIDAZOLAM HCL 5 MG/5ML IJ SOLN
INTRAMUSCULAR | Status: DC | PRN
  Administered 2020-07-26: 2 mg via INTRAVENOUS

## 2020-07-26 MED ORDER — PROPOFOL 10 MG/ML IV BOLUS
INTRAVENOUS | Status: DC | PRN
  Administered 2020-07-26 (×2): 100 mg via INTRAVENOUS

## 2020-07-26 MED ORDER — ONDANSETRON HCL 4 MG/2ML IJ SOLN: 4.0000 mg | Freq: Once | INTRAMUSCULAR | Status: DC | PRN

## 2020-07-26 MED ORDER — BUPIVACAINE LIPOSOME 1.3 % IJ SUSP
INTRAMUSCULAR | Status: AC
  Filled 2020-07-26: qty 20

## 2020-07-26 MED ORDER — CEFAZOLIN SODIUM-DEXTROSE 2-4 GM/100ML-% IV SOLN
INTRAVENOUS | Status: AC
  Filled 2020-07-26: qty 100

## 2020-07-26 MED ORDER — ORAL CARE MOUTH RINSE: 15.0000 mL | Freq: Once | OROMUCOSAL | Status: DC

## 2020-07-26 MED ORDER — ONDANSETRON HCL 4 MG/2ML IJ SOLN
INTRAMUSCULAR | Status: AC
  Filled 2020-07-26: qty 2

## 2020-07-26 MED ORDER — CHLORHEXIDINE GLUCONATE 0.12 % MT SOLN: 15.0000 mL | Freq: Once | OROMUCOSAL | Status: DC

## 2020-07-26 MED ORDER — PROPOFOL 10 MG/ML IV BOLUS
INTRAVENOUS | Status: AC
  Filled 2020-07-26: qty 20

## 2020-07-26 MED ORDER — ONDANSETRON HCL 4 MG/2ML IJ SOLN
INTRAMUSCULAR | Status: DC | PRN
  Administered 2020-07-26: 4 mg via INTRAVENOUS

## 2020-07-26 MED ORDER — LIDOCAINE HCL (CARDIAC) PF 100 MG/5ML IV SOSY
PREFILLED_SYRINGE | INTRAVENOUS | Status: DC | PRN
  Administered 2020-07-26: 100 mg via INTRATRACHEAL

## 2020-07-26 MED ORDER — LACTATED RINGERS IV SOLN: INTRAVENOUS | Status: DC

## 2020-07-26 MED ORDER — DEXAMETHASONE SODIUM PHOSPHATE 10 MG/ML IJ SOLN
INTRAMUSCULAR | Status: AC
  Filled 2020-07-26: qty 1

## 2020-07-26 MED ORDER — OXYCODONE-ACETAMINOPHEN 5-325 MG PO TABS
1.0000 | ORAL_TABLET | Freq: Once | ORAL | Status: AC
  Administered 2020-07-26: 1 via ORAL

## 2020-07-26 MED ORDER — OXYCODONE-ACETAMINOPHEN 5-325 MG PO TABS
ORAL_TABLET | ORAL | Status: AC
  Filled 2020-07-26: qty 1

## 2020-07-26 MED ORDER — FENTANYL CITRATE (PF) 100 MCG/2ML IJ SOLN
INTRAMUSCULAR | Status: DC | PRN
  Administered 2020-07-26: 25 ug via INTRAVENOUS
  Administered 2020-07-26: 50 ug via INTRAVENOUS
  Administered 2020-07-26: 25 ug via INTRAVENOUS

## 2020-07-26 MED ORDER — DEXAMETHASONE SODIUM PHOSPHATE 10 MG/ML IJ SOLN
INTRAMUSCULAR | Status: DC | PRN
  Administered 2020-07-26: 10 mg via INTRAVENOUS

## 2020-07-26 MED ORDER — KETOROLAC TROMETHAMINE 30 MG/ML IJ SOLN
30.0000 mg | Freq: Once | INTRAMUSCULAR | Status: AC
  Administered 2020-07-26: 30 mg via INTRAVENOUS
  Filled 2020-07-26: qty 1

## 2020-07-26 MED ORDER — MIDAZOLAM HCL 2 MG/2ML IJ SOLN
INTRAMUSCULAR | Status: AC
  Filled 2020-07-26: qty 2

## 2020-07-26 MED ORDER — LACTATED RINGERS IV SOLN: INTRAVENOUS | Status: DC | PRN

## 2020-07-26 MED ORDER — CHLORHEXIDINE GLUCONATE 0.12 % MT SOLN
OROMUCOSAL | Status: AC
  Filled 2020-07-26: qty 15

## 2020-07-26 SURGICAL SUPPLY — 32 items
ADH SKN CLS APL DERMABOND .7 (GAUZE/BANDAGES/DRESSINGS) ×1
CLOTH BEACON ORANGE TIMEOUT ST (SAFETY) ×2 IMPLANT
COVER LIGHT HANDLE STERIS (MISCELLANEOUS) ×4 IMPLANT
COVER WAND RF STERILE (DRAPES) ×2 IMPLANT
DERMABOND ADVANCED (GAUZE/BANDAGES/DRESSINGS) ×1
DERMABOND ADVANCED .7 DNX12 (GAUZE/BANDAGES/DRESSINGS) ×1 IMPLANT
DRAIN PENROSE 0.5X18 (DRAIN) ×2 IMPLANT
ELECT REM PT RETURN 9FT ADLT (ELECTROSURGICAL) ×2
ELECTRODE REM PT RTRN 9FT ADLT (ELECTROSURGICAL) ×1 IMPLANT
GAUZE SPONGE 4X4 12PLY STRL (GAUZE/BANDAGES/DRESSINGS) ×2 IMPLANT
GLOVE SURG SS PI 7.5 STRL IVOR (GLOVE) ×2 IMPLANT
GLOVE SURG UNDER POLY LF SZ7 (GLOVE) ×6 IMPLANT
GOWN STRL REUS W/TWL LRG LVL3 (GOWN DISPOSABLE) ×6 IMPLANT
INST SET MINOR GENERAL (KITS) ×2 IMPLANT
KIT TURNOVER KIT A (KITS) ×2 IMPLANT
MANIFOLD NEPTUNE II (INSTRUMENTS) ×2 IMPLANT
NEEDLE HYPO 18GX1.5 BLUNT FILL (NEEDLE) ×2 IMPLANT
NEEDLE HYPO 21X1.5 SAFETY (NEEDLE) ×2 IMPLANT
NS IRRIG 1000ML POUR BTL (IV SOLUTION) ×2 IMPLANT
PACK MINOR (CUSTOM PROCEDURE TRAY) ×2 IMPLANT
PAD ARMBOARD 7.5X6 YLW CONV (MISCELLANEOUS) ×2 IMPLANT
PENCIL SMOKE EVACUATOR (MISCELLANEOUS) ×2 IMPLANT
SET BASIN LINEN APH (SET/KITS/TRAYS/PACK) ×2 IMPLANT
SOL PREP PROV IODINE SCRUB 4OZ (MISCELLANEOUS) ×2 IMPLANT
SUT MNCRL AB 4-0 PS2 18 (SUTURE) ×2 IMPLANT
SUT NOVA NAB GS-22 2 2-0 T-19 (SUTURE) ×4 IMPLANT
SUT VIC AB 2-0 CT1 27 (SUTURE) ×2
SUT VIC AB 2-0 CT1 TAPERPNT 27 (SUTURE) ×1 IMPLANT
SUT VIC AB 3-0 SH 27 (SUTURE) ×2
SUT VIC AB 3-0 SH 27X BRD (SUTURE) ×1 IMPLANT
SUT VICRYL AB 3 0 TIES (SUTURE) ×2 IMPLANT
SYR 20ML LL LF (SYRINGE) ×4 IMPLANT

## 2020-07-26 NOTE — Op Note (Signed)
Patient:  Joseph Young  DOB:  10-11-1975  MRN:  [id number]   Preop Diagnosis: Right groin pain  Postop Diagnosis: Same, ilioinguinal neuroma  Procedure: Right groin exploration, excision of ileal inguinal nerve  Surgeon: Aviva Signs, MD  Anes: General endotracheal  Indications: Patient is a 45 year old black male status post right inguinal herniorrhaphy with mesh in 2021 who presented back to my care with right groin pain extending to the right testicle as well as a possible right inguinal hernia.  Risks and benefits of the procedure including bleeding, infection, numbness, and the possibility of recurrence of the hernia were fully explained to the patient, who gave informed consent.  Procedure note: The patient was placed in the supine position.  After induction of general endotracheal anesthesia, the right groin region was prepped and draped using usual sterile technique with Betadine.  Surgical site confirmation was performed.  Incision was made in the right groin region down to the external Bleich aponeurosis.  The aponeurosis was incised to the external ring.  A significant amount of scarring was noted around the internal ring.  I was able to get down to the floor of the inguinal canal.  The mesh repair was intact and there was no evidence of a direct or indirect hernia.  I was able to identify the ilioinguinal nerve and it appeared to have formed a neuroma at the internal ring region.  I did freed it from this area and a high ligation was performed using a 3-0 Vicryl tie.  The nerve was excised.  Exparel was instilled into the area of the nerve.  Additional Exparel was placed laterally in the abdominal wall.  I could not identify any other nerves.  The vasculature of the spermatic cord was intact.  The vas deferens was noted to be intact.  I did bring the lateral aspect of the external Bleich aponeurosis back over the tissue towards the internal ring.  The remaining external Bleich was  significantly attenuated.  The subcutaneous layer was reapproximated using 3-0 Vicryl interrupted sutures.  The skin was closed using a 4-0 Monocryl subcuticular suture.  Dermabond was applied.  All tape and needle counts were correct at the end of the procedure.  Patient was extubated in the operating room and transferred to PACU in stable condition.  Complications: None  EBL: Minimal  Specimen: None

## 2020-07-26 NOTE — Transfer of Care (Signed)
Immediate Anesthesia Transfer of Care Note  Patient: Joseph Young  Procedure(s) Performed: RIGHT GROIN EXPLORATION WITH NEUROLYSIS (Right )  Patient Location: PACU  Anesthesia Type:General  Level of Consciousness: awake, alert , oriented and patient cooperative  Airway & Oxygen Therapy: Patient Spontanous Breathing  Post-op Assessment: Report given to RN and Post -op Vital signs reviewed and stable  Post vital signs: Reviewed and stable  Last Vitals:  Vitals Value Taken Time  BP    Temp    Pulse    Resp    SpO2      Last Pain:  Vitals:   07/26/20 0712  TempSrc: Oral  PainSc: 2          Complications: No complications documented.

## 2020-07-26 NOTE — Discharge Instructions (Signed)
Monitored Anesthesia Care, Care After This sheet gives you information about how to care for yourself after your procedure. Your health care provider may also give you more specific instructions. If you have problems or questions, contact your health care provider. What can I expect after the procedure? After the procedure, it is common to have:  Tiredness.  Forgetfulness about what happened after the procedure.  Impaired judgment for important decisions.  Nausea or vomiting.  Some difficulty with balance. Follow these instructions at home: For the time period you were told by your health care provider:  Rest as needed.  Do not participate in activities where you could fall or become injured.  Do not drive or use machinery.  Do not drink alcohol.  Do not take sleeping pills or medicines that cause drowsiness.  Do not make important decisions or sign legal documents.  Do not take care of children on your own.      Eating and drinking  Follow the diet that is recommended by your health care provider.  Drink enough fluid to keep your urine pale yellow.  If you vomit: ? Drink water, juice, or soup when you can drink without vomiting. ? Make sure you have little or no nausea before eating solid foods. General instructions  Have a responsible adult stay with you for the time you are told. It is important to have someone help care for you until you are awake and alert.  Take over-the-counter and prescription medicines only as told by your health care provider.  If you have sleep apnea, surgery and certain medicines can increase your risk for breathing problems. Follow instructions from your health care provider about wearing your sleep device: ? Anytime you are sleeping, including during daytime naps. ? While taking prescription pain medicines, sleeping medicines, or medicines that make you drowsy.  Avoid smoking.  Keep all follow-up visits as told by your health care  provider. This is important. Contact a health care provider if:  You keep feeling nauseous or you keep vomiting.  You feel light-headed.  You are still sleepy or having trouble with balance after 24 hours.  You develop a rash.  You have a fever.  You have redness or swelling around the IV site. Get help right away if:  You have trouble breathing.  You have new-onset confusion at home. Summary  For several hours after your procedure, you may feel tired. You may also be forgetful and have poor judgment.  Have a responsible adult stay with you for the time you are told. It is important to have someone help care for you until you are awake and alert.  Rest as told. Do not drive or operate machinery. Do not drink alcohol or take sleeping pills.  Get help right away if you have trouble breathing, or if you suddenly become confused. This information is not intended to replace advice given to you by your health care provider. Make sure you discuss any questions you have with your health care provider. Document Revised: 11/13/2019 Document Reviewed: 01/30/2019 Elsevier Patient Education  2021 [employer] Inc[employer]  

## 2020-07-26 NOTE — Interval H&P Note (Signed)
History and Physical Interval Note:  07/26/2020 7:15 AM  Joseph Young  has presented today for surgery, with the diagnosis of Recurrent right inguinal hernia.  The various methods of treatment have been discussed with the patient and family. After consideration of risks, benefits and other options for treatment, the patient has consented to  Procedure(s): RECURRENT; HERNIA REPAIR INGUINAL ADULT W/MESH (Right) as a surgical intervention.  The patient's history has been reviewed, patient examined, no change in status, stable for surgery.  I have reviewed the patient's chart and labs.  Questions were answered to the patient's satisfaction.     Aviva Signs

## 2020-07-26 NOTE — Anesthesia Postprocedure Evaluation (Signed)
Anesthesia Post Note  Patient: Joseph Young  Procedure(s) Performed: RIGHT GROIN EXPLORATION WITH NEUROLYSIS (Right )  Patient location during evaluation: PACU Anesthesia Type: General Level of consciousness: awake and alert, oriented and patient cooperative Pain management: pain level controlled Vital Signs Assessment: post-procedure vital signs reviewed and stable Respiratory status: spontaneous breathing Cardiovascular status: blood pressure returned to baseline and stable Postop Assessment: no headache, no backache and no apparent nausea or vomiting Anesthetic complications: no   No complications documented.   Last Vitals:  Vitals:   07/26/20 0712 07/26/20 0713  BP:  (!) 147/91  Pulse:  74  Resp: 14   Temp: 36.9 C   SpO2:  97%    Last Pain:  Vitals:   07/26/20 0712  TempSrc: Oral  PainSc: 2                  Kavina Cantave

## 2020-07-26 NOTE — Anesthesia Preprocedure Evaluation (Signed)
Anesthesia Evaluation  Patient identified by MRN, date of birth, ID band Patient awake    Reviewed: Allergy & Precautions, H&P , NPO status , Patient's Chart, lab work & pertinent test results, reviewed documented beta blocker date and time   Airway Mallampati: II  TM Distance: >3 FB Neck ROM: full    Dental no notable dental hx.    Pulmonary neg pulmonary ROS, Current Smoker and Patient abstained from smoking.,    Pulmonary exam normal breath sounds clear to auscultation       Cardiovascular Exercise Tolerance: Good negative cardio ROS   Rhythm:regular Rate:Normal     Neuro/Psych negative neurological ROS  negative psych ROS   GI/Hepatic negative GI ROS, Neg liver ROS,   Endo/Other  negative endocrine ROS  Renal/GU negative Renal ROS  negative genitourinary   Musculoskeletal   Abdominal   Peds  Hematology negative hematology ROS (+)   Anesthesia Other Findings   Reproductive/Obstetrics negative OB ROS                             Anesthesia Physical Anesthesia Plan  ASA: II  Anesthesia Plan: General and General LMA   Post-op Pain Management:    Induction:   PONV Risk Score and Plan: Ondansetron  Airway Management Planned:   Additional Equipment:   Intra-op Plan:   Post-operative Plan:   Informed Consent: I have reviewed the patients History and Physical, chart, labs and discussed the procedure including the risks, benefits and alternatives for the proposed anesthesia with the patient or authorized representative who has indicated his/her understanding and acceptance.     Dental Advisory Given  Plan Discussed with: CRNA  Anesthesia Plan Comments:         Anesthesia Quick Evaluation

## 2020-07-27 ENCOUNTER — Encounter (HOSPITAL_COMMUNITY): Payer: Self-pay | Admitting: General Surgery

## 2020-08-03 ENCOUNTER — Encounter: Payer: Self-pay | Admitting: General Surgery

## 2020-08-03 ENCOUNTER — Other Ambulatory Visit: Payer: Self-pay

## 2020-08-03 ENCOUNTER — Ambulatory Visit (INDEPENDENT_AMBULATORY_CARE_PROVIDER_SITE_OTHER): Payer: Self-pay | Admitting: General Surgery

## 2020-08-03 VITALS — BP 143/84 | HR 70 | Temp 98.6°F | Resp 16 | Ht 73.0 in | Wt 195.0 lb

## 2020-08-03 DIAGNOSIS — Z09 Encounter for follow-up examination after completed treatment for conditions other than malignant neoplasm: Secondary | ICD-10-CM

## 2020-08-03 MED ORDER — OXYCODONE-ACETAMINOPHEN 5-325 MG PO TABS: 1.0000 | ORAL_TABLET | ORAL | 0 refills | Status: AC | PRN

## 2020-08-03 NOTE — Progress Notes (Signed)
Subjective:     Joseph Young  Here for postoperative visit, status post right groin exploration.  Patient states he is still having incisional pain.  He does have some numbness in the inner groin, but it seems to be more a tingling sensation.  He has had minimal swelling. Objective:    BP (!) 143/84   Pulse 70   Temp 98.6 F (37 C) (Other (Comment))   Resp 16   Ht 6\' 1"  (1.854 m)   Wt 195 lb (88.5 kg)   SpO2 98%   BMI 25.73 kg/m   General:  alert, cooperative and no distress  Right groin incision healing well.  Some thickening of the tissue noted deep in the soft tissue.  No hernia present.     Assessment:    Doing as expected postoperatively.  I reassured the patient that we had to wait to see the final results as reexcision of the right groin is more complicated and painful.    Plan:   Percocet reordered for pain.  We will see patient again in 1 month for follow-up.  He was instructed to use ibuprofen to help out with the pain.

## 2020-09-07 ENCOUNTER — Ambulatory Visit (INDEPENDENT_AMBULATORY_CARE_PROVIDER_SITE_OTHER): Payer: Self-pay | Admitting: General Surgery

## 2020-09-07 ENCOUNTER — Other Ambulatory Visit: Payer: Self-pay

## 2020-09-07 ENCOUNTER — Encounter: Payer: Self-pay | Admitting: General Surgery

## 2020-09-07 VITALS — BP 131/84 | HR 94 | Temp 98.6°F | Resp 14 | Ht 73.0 in | Wt 191.2 lb

## 2020-09-07 DIAGNOSIS — Z09 Encounter for follow-up examination after completed treatment for conditions other than malignant neoplasm: Secondary | ICD-10-CM

## 2020-09-07 NOTE — Progress Notes (Signed)
Subjective:     Joseph Young  Here for follow-up visit, status post right groin exploration, status post right inguinal herniorrhaphy with mesh.  Patient states he is having no pain in the right groin region.  He does have occasional point pressure sensation in the right groin region.  He has been avoiding heavy lifting. Objective:    BP 131/84   Pulse 94   Temp 98.6 F (37 C) (Oral)   Resp 14   Ht 6\' 1"  (1.854 m)   Wt 191 lb 3.2 oz (86.7 kg)   SpO2 95%   BMI 25.23 kg/m   General:  alert, cooperative, and no distress  No palpable inguinal hernia present.  No pain to palpation in the right groin region.     Assessment:    Doing well postoperatively.    Plan:   Increase activity as able.  No limitations at this point.  Follow-up here as needed.

## 2021-02-19 ENCOUNTER — Encounter (HOSPITAL_COMMUNITY): Payer: Self-pay

## 2021-02-19 ENCOUNTER — Emergency Department (HOSPITAL_COMMUNITY)
Admission: EM | Admit: 2021-02-19 | Discharge: 2021-02-19 | Disposition: A | Discharge status: Home / Self Care | Payer: No Typology Code available for payment source | Attending: Emergency Medicine | Admitting: Emergency Medicine

## 2021-02-19 ENCOUNTER — Emergency Department (HOSPITAL_COMMUNITY): Payer: No Typology Code available for payment source

## 2021-02-19 ENCOUNTER — Other Ambulatory Visit: Payer: Self-pay

## 2021-02-19 DIAGNOSIS — Y92410 Unspecified street and highway as the place of occurrence of the external cause: Secondary | ICD-10-CM | POA: Insufficient documentation

## 2021-02-19 DIAGNOSIS — T1490XA Injury, unspecified, initial encounter: Secondary | ICD-10-CM

## 2021-02-19 DIAGNOSIS — F17210 Nicotine dependence, cigarettes, uncomplicated: Secondary | ICD-10-CM | POA: Diagnosis not present

## 2021-02-19 DIAGNOSIS — S299XXA Unspecified injury of thorax, initial encounter: Secondary | ICD-10-CM | POA: Diagnosis present

## 2021-02-19 DIAGNOSIS — S298XXA Other specified injuries of thorax, initial encounter: Secondary | ICD-10-CM

## 2021-02-19 LAB — COMPREHENSIVE METABOLIC PANEL
ALT: 50 U/L — ABNORMAL HIGH (ref 0–44)
AST: 32 U/L (ref 15–41)
Albumin: 4.6 g/dL (ref 3.5–5.0)
Alkaline Phosphatase: 68 U/L (ref 38–126)
Anion gap: 9 (ref 5–15)
BUN: 9 mg/dL (ref 6–20)
CO2: 25 mmol/L (ref 22–32)
Calcium: 9.2 mg/dL (ref 8.9–10.3)
Chloride: 103 mmol/L (ref 98–111)
Creatinine, Ser: 1.1 mg/dL (ref 0.61–1.24)
GFR, Estimated: >60 mL/min (ref >60)
Glucose, Bld: 101 mg/dL — ABNORMAL HIGH (ref 70–99)
Potassium: 4 mmol/L (ref 3.5–5.1)
Sodium: 137 mmol/L (ref 135–145)
Total Bilirubin: 0.4 mg/dL (ref 0.3–1.2)
Total Protein: 8.2 g/dL — ABNORMAL HIGH (ref 6.5–8.1)

## 2021-02-19 LAB — CBC WITH DIFFERENTIAL/PLATELET
Abs Immature Granulocytes: 0.03 10*3/uL (ref 0.00–0.07)
Basophils Absolute: 0.1 10*3/uL (ref 0.0–0.1)
Basophils Relative: 1 %
Eosinophils Absolute: 0.1 10*3/uL (ref 0.0–0.5)
Eosinophils Relative: 1 %
HCT: 47.5 % (ref 39.0–52.0)
Hemoglobin: 16 g/dL (ref 13.0–17.0)
Immature Granulocytes: 0 %
Lymphocytes Relative: 43 %
Lymphs Abs: 3.8 10*3/uL (ref 0.7–4.0)
MCH: 33.1 pg (ref 26.0–34.0)
MCHC: 33.7 g/dL (ref 30.0–36.0)
MCV: 98.3 fL (ref 80.0–100.0)
Monocytes Absolute: 0.5 10*3/uL (ref 0.1–1.0)
Monocytes Relative: 6 %
Neutro Abs: 4.2 10*3/uL (ref 1.7–7.7)
Neutrophils Relative %: 49 %
Platelets: 278 10*3/uL (ref 150–400)
RBC: 4.83 MIL/uL (ref 4.22–5.81)
RDW: 13.2 % (ref 11.5–15.5)
WBC: 8.7 10*3/uL (ref 4.0–10.5)
nRBC: 0 % (ref 0.0–0.2)

## 2021-02-19 MED ORDER — ONDANSETRON HCL 4 MG/2ML IJ SOLN
4.0000 mg | Freq: Once | INTRAMUSCULAR | Status: AC
  Administered 2021-02-19: 4 mg via INTRAVENOUS
  Filled 2021-02-19: qty 2

## 2021-02-19 MED ORDER — METHOCARBAMOL 500 MG PO TABS: 500.0000 mg | ORAL_TABLET | Freq: Three times a day (TID) | ORAL | 0 refills | Status: DC | PRN

## 2021-02-19 MED ORDER — IOHEXOL 300 MG/ML  SOLN
100.0000 mL | Freq: Once | INTRAMUSCULAR | Status: AC | PRN
  Administered 2021-02-19: 100 mL via INTRAVENOUS

## 2021-02-19 MED ORDER — HYDROMORPHONE HCL 1 MG/ML IJ SOLN
1.0000 mg | Freq: Once | INTRAMUSCULAR | Status: AC
  Administered 2021-02-19: 1 mg via INTRAVENOUS
  Filled 2021-02-19: qty 1

## 2021-02-19 MED ORDER — IBUPROFEN 800 MG PO TABS: 800.0000 mg | ORAL_TABLET | Freq: Four times a day (QID) | ORAL | 0 refills | Status: DC | PRN

## 2021-02-19 MED ORDER — SODIUM CHLORIDE 0.9 % IV BOLUS
1000.0000 mL | Freq: Once | INTRAVENOUS | Status: AC
  Administered 2021-02-19: 1000 mL via INTRAVENOUS
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
## 2023-01-20 ENCOUNTER — Encounter (HOSPITAL_COMMUNITY): Payer: Self-pay

## 2023-01-20 ENCOUNTER — Other Ambulatory Visit: Payer: Self-pay

## 2023-01-20 ENCOUNTER — Emergency Department (HOSPITAL_COMMUNITY)
Admission: EM | Admit: 2023-01-20 | Discharge: 2023-01-20 | Disposition: A | Discharge status: Home / Self Care | Payer: Self-pay | Attending: Emergency Medicine | Admitting: Emergency Medicine

## 2023-01-20 ENCOUNTER — Emergency Department (HOSPITAL_COMMUNITY): Payer: Self-pay

## 2023-01-20 DIAGNOSIS — Y990 Civilian activity done for income or pay: Secondary | ICD-10-CM | POA: Insufficient documentation

## 2023-01-20 DIAGNOSIS — S46002A Unspecified injury of muscle(s) and tendon(s) of the rotator cuff of left shoulder, initial encounter: Secondary | ICD-10-CM | POA: Insufficient documentation

## 2023-01-20 DIAGNOSIS — M25512 Pain in left shoulder: Secondary | ICD-10-CM

## 2023-01-20 DIAGNOSIS — W502XXA Accidental twist by another person, initial encounter: Secondary | ICD-10-CM | POA: Insufficient documentation

## 2023-01-20 DIAGNOSIS — F17200 Nicotine dependence, unspecified, uncomplicated: Secondary | ICD-10-CM | POA: Insufficient documentation

## 2023-01-20 MED ORDER — HYDROCODONE-ACETAMINOPHEN 5-325 MG PO TABS: 1.0000 | ORAL_TABLET | ORAL | 0 refills | Status: DC | PRN

## 2023-01-20 NOTE — ED Triage Notes (Signed)
LEFT shoulder pain Started yesterday when pt put his arms up to catch a ball.   Denies numbness in fingers

## 2023-01-20 NOTE — Discharge Instructions (Addendum)
Left shoulder x-ray without any significant bony abnormalities.  Make an appointment to follow-up with Dr. Romeo Apple locally or follow back up with your [employer] orthopedic surgeon in [place].  Wear the shoulder immobilizer at all times can take off to shower.  Take the pain medicine as directed.  Continue your anti-inflammatory medicine you are already on.

## 2023-01-20 NOTE — ED Notes (Signed)
Pt not able to lift left arm after blocking a ball yesterday.  Pt did say he had a recent fall and landed on that same shoulder.  Pt states fall occurred due to his knee giving out. Denies hitting his head with fall.

## 2023-01-20 NOTE — ED Notes (Signed)
ED Provider at bedside. 

## 2023-01-20 NOTE — ED Provider Notes (Signed)
[place] EMERGENCY DEPARTMENT AT [facility] Provider Note   CSN: [id number] Arrival date & time: 01/20/23  0957     History  Chief Complaint  Patient presents with   Shoulder Pain    Joseph Young is a 47 y.o. male.  Patient currently being followed by orthopedics in [place] for a left knee injury that occurred on the job.  Patient with a injury to his left shoulder secondary to fall that did not occur at work but he feels that the fall occurred because of the pain in his left knee.  This occurred a week and a half ago.  Patient has surgery scheduled on November 25 for left knee meniscus injury.  Patient tried to block a ball that was thrown his way yesterday and when he raised his arm he got severe pain in that left shoulder.  Increasing the difficulty raising his head and above his head.  Some of that was present prior.  No numbness or weakness to the fingers.  No other injuries or concerns.  Patient is already on anti-inflammatory medicine due to the knee.  Patient is an everyday smoker.  Past medical history otherwise is noncontributory.       Home Medications Prior to Admission medications   Medication Sig Start Date End Date Taking? Authorizing Provider  HYDROcodone-acetaminophen (NORCO/VICODIN) 5-325 MG tablet Take 1 tablet by mouth every 4 (four) hours as needed. 01/20/23  Yes Vanetta Mulders, MD  ibuprofen (ADVIL) 800 MG tablet Take 1 tablet (800 mg total) by mouth every 6 (six) hours as needed for moderate pain. 02/19/21  Yes Pollina, Canary Brim, MD      Allergies    Patient has no known allergies.    Review of Systems   Review of Systems  Constitutional:  Negative for chills and fever.  HENT:  Negative for ear pain and sore throat.   Eyes:  Negative for pain and visual disturbance.  Respiratory:  Negative for cough and shortness of breath.   Cardiovascular:  Negative for chest pain and palpitations.  Gastrointestinal:  Negative for abdominal pain  and vomiting.  Genitourinary:  Negative for dysuria and hematuria.  Musculoskeletal:  Negative for arthralgias and back pain.  Skin:  Negative for color change and rash.  Neurological:  Negative for seizures and syncope.  All other systems reviewed and are negative.   Physical Exam Updated Vital Signs BP (!) 155/100 (BP Location: Right Arm)   Pulse (!) 57   Temp 98.6 F (37 C) (Oral)   Resp 14   Ht 1.88 m (6\' 2" )   Wt 74.8 kg   SpO2 100%   BMI 21.18 kg/m  Physical Exam Vitals and nursing note reviewed.  Constitutional:      General: He is not in acute distress.    Appearance: Normal appearance. He is well-developed.  HENT:     Head: Normocephalic and atraumatic.  Eyes:     Extraocular Movements: Extraocular movements intact.     Conjunctiva/sclera: Conjunctivae normal.     Pupils: Pupils are equal, round, and reactive to light.  Cardiovascular:     Rate and Rhythm: Normal rate and regular rhythm.     Heart sounds: No murmur heard. Pulmonary:     Effort: Pulmonary effort is normal. No respiratory distress.     Breath sounds: Normal breath sounds.  Abdominal:     Palpations: Abdomen is soft.     Tenderness: There is no abdominal tenderness.  Musculoskeletal:  General: No swelling.     Cervical back: Neck supple.     Comments: Patient with significant decreased range of motion to the left shoulder.  Not able to raise his hand to the top of his head.  Secondary to pain.  No obvious deformity.  Distally radial pulses 2+ neurovascularly intact with the fingers good strength with grip and fingers.  Skin:    General: Skin is warm and dry.     Capillary Refill: Capillary refill takes less than 2 seconds.  Neurological:     General: No focal deficit present.     Mental Status: He is alert and oriented to person, place, and time.  Psychiatric:        Mood and Affect: Mood normal.     ED Results / Procedures / Treatments   Labs (all labs ordered are listed, but only  abnormal results are displayed) Labs Reviewed - No data to display  EKG None  Radiology DG Shoulder Left  Result Date: 01/20/2023 CLINICAL DATA:  Twisting injury. EXAM: LEFT SHOULDER - 2+ VIEW COMPARISON:  None Available. FINDINGS: No acute fracture or dislocation. Visualized portion of the left hemithorax is normal. Degenerative changes of the acromioclavicular joint are relatively mild. IMPRESSION: No acute osseous abnormality. Electronically Signed   By: Jeronimo Greaves M.D.   On: 01/20/2023 11:42    Procedures Procedures    Medications Ordered in ED Medications - No data to display  ED Course/ Medical Decision Making/ A&P                                 Medical Decision Making Amount and/or Complexity of Data Reviewed Radiology: ordered.  Risk Prescription drug management.   X-ray of the left shoulder without any acute bony abnormalities no evidence of any dislocation.  Will have patient follow back up with [employer] orthopedist or if he wants to gave him referral here locally in [place] for follow-up.  Patient already on anti-inflammatory medicines for his left knee meniscus injury.  Will give a short course of hydrocodone to help with additional pain and shoulder immobilizer.  Patient is already out of work secondary to the knee injury.  Does not need a work note.   Final Clinical Impression(s) / ED Diagnoses Final diagnoses:  Acute pain of left shoulder  Injury of left rotator cuff, initial encounter    Rx / DC Orders ED Discharge Orders          Ordered    HYDROcodone-acetaminophen (NORCO/VICODIN) 5-325 MG tablet  Every 4 hours PRN        01/20/23 1206              Vanetta Mulders, MD 01/20/23 1210

## 2023-04-18 ENCOUNTER — Other Ambulatory Visit: Payer: Self-pay

## 2023-04-18 ENCOUNTER — Emergency Department (HOSPITAL_COMMUNITY): Payer: Medicaid Other

## 2023-04-18 ENCOUNTER — Emergency Department (HOSPITAL_COMMUNITY)
Admission: EM | Admit: 2023-04-18 | Discharge: 2023-04-18 | Disposition: A | Discharge status: Home / Self Care | Payer: Medicaid Other | Attending: Emergency Medicine | Admitting: Emergency Medicine

## 2023-04-18 ENCOUNTER — Encounter (HOSPITAL_COMMUNITY): Payer: Self-pay

## 2023-04-18 DIAGNOSIS — W208XXA Other cause of strike by thrown, projected or falling object, initial encounter: Secondary | ICD-10-CM | POA: Diagnosis not present

## 2023-04-18 DIAGNOSIS — S67190A Crushing injury of right index finger, initial encounter: Secondary | ICD-10-CM | POA: Diagnosis present

## 2023-04-18 DIAGNOSIS — Z23 Encounter for immunization: Secondary | ICD-10-CM | POA: Insufficient documentation

## 2023-04-18 DIAGNOSIS — S61210A Laceration without foreign body of right index finger without damage to nail, initial encounter: Secondary | ICD-10-CM | POA: Diagnosis not present

## 2023-04-18 DIAGNOSIS — S6710XA Crushing injury of unspecified finger(s), initial encounter: Secondary | ICD-10-CM

## 2023-04-18 MED ORDER — TETANUS-DIPHTH-ACELL PERTUSSIS 5-2.5-18.5 LF-MCG/0.5 IM SUSY
0.5000 mL | PREFILLED_SYRINGE | Freq: Once | INTRAMUSCULAR | Status: AC
  Administered 2023-04-18: 0.5 mL via INTRAMUSCULAR
  Filled 2023-04-18: qty 0.5

## 2023-04-18 MED ORDER — BUPIVACAINE HCL (PF) 0.5 % IJ SOLN
10.0000 mL | Freq: Once | INTRAMUSCULAR | Status: AC
  Administered 2023-04-18: 10 mL
  Filled 2023-04-18: qty 30

## 2023-04-18 MED ORDER — IBUPROFEN 800 MG PO TABS: 800.0000 mg | ORAL_TABLET | Freq: Three times a day (TID) | ORAL | 0 refills | Status: AC

## 2023-04-18 MED ORDER — ACETAMINOPHEN 500 MG PO TABS: 1000.0000 mg | ORAL_TABLET | Freq: Three times a day (TID) | ORAL | 0 refills | Status: AC | PRN

## 2023-04-18 NOTE — Discharge Instructions (Signed)
 Today you are seen for crush injury to your right index finger.  Please follow-up with your primary care or urgent care in approximately 7 to 10 days for suture removal.  Please pick up your Tylenol  and Motrin  as needed for pain.  Thank you for letting us  treat you today. After forming physical exam and reviewing your imaging, I feel you are safe to go home. Please follow up with your PCP in the next several days and provide them with your records from this visit. Return to the Emergency Room if pain becomes severe or symptoms worsen.
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
## 2023-04-23 ENCOUNTER — Other Ambulatory Visit: Payer: Self-pay

## 2023-04-23 ENCOUNTER — Encounter (HOSPITAL_COMMUNITY): Payer: Self-pay

## 2023-04-23 ENCOUNTER — Emergency Department (HOSPITAL_COMMUNITY)
Admission: EM | Admit: 2023-04-23 | Discharge: 2023-04-23 | Disposition: A | Discharge status: Home / Self Care | Payer: Medicaid Other | Attending: Emergency Medicine | Admitting: Emergency Medicine

## 2023-04-23 DIAGNOSIS — Z4801 Encounter for change or removal of surgical wound dressing: Secondary | ICD-10-CM | POA: Diagnosis present

## 2023-04-23 DIAGNOSIS — Z5189 Encounter for other specified aftercare: Secondary | ICD-10-CM

## 2023-04-23 DIAGNOSIS — L03011 Cellulitis of right finger: Secondary | ICD-10-CM | POA: Diagnosis not present

## 2023-04-23 MED ORDER — OXYCODONE-ACETAMINOPHEN 5-325 MG PO TABS: 1.0000 | ORAL_TABLET | Freq: Four times a day (QID) | ORAL | 0 refills | Status: AC | PRN

## 2023-04-23 MED ORDER — CEPHALEXIN 500 MG PO CAPS: 500.0000 mg | ORAL_CAPSULE | Freq: Four times a day (QID) | ORAL | 0 refills | Status: AC

## 2023-04-23 NOTE — Discharge Instructions (Signed)
 You were seen in the ER today with concerns of finger pain. Your laceration appears to be healing well but with worsened pain and some possible drainage seen, I have started you on antibiotic therapy. Try to limit the use of this injured finger as it will likely worsen your pain. Return to the ER for any new or worsening symptoms.

## 2023-04-23 NOTE — ED Provider Notes (Signed)
[place] EMERGENCY DEPARTMENT AT [facility] Provider Note   CSN: [id number] Arrival date & time: 04/23/23  1123     History Chief Complaint  Patient presents with   Wound Check    Joseph Young is a 48 y.o. male. Patient presents to the ED for a wound check. He reports that he sustained a laceration to the right index finger about 5 days ago and had sutures placed in it here in the ED. Concerned for some worsening pain and tingling as well as drainage from the wound. Denies any recent fevers.   Wound Check       Home Medications Prior to Admission medications   Medication Sig Start Date End Date Taking? Authorizing Provider  cephALEXin (KEFLEX) 500 MG capsule Take 1 capsule (500 mg total) by mouth 4 (four) times daily. 04/23/23  Yes Smitty Knudsen, PA-C  oxyCODONE-acetaminophen (PERCOCET/ROXICET) 5-325 MG tablet Take 1 tablet by mouth every 6 (six) hours as needed for severe pain (pain score 7-10). 04/23/23  Yes Smitty Knudsen, PA-C  acetaminophen (TYLENOL) 500 MG tablet Take 2 tablets (1,000 mg total) by mouth every 8 (eight) hours as needed. 04/18/23   Dolphus Jenny, PA-C  ibuprofen (ADVIL) 800 MG tablet Take 1 tablet (800 mg total) by mouth 3 (three) times daily. 04/18/23   Dolphus Jenny, PA-C      Allergies    Patient has no known allergies.    Review of Systems   Review of Systems  Skin:  Positive for wound.  All other systems reviewed and are negative.   Physical Exam Updated Vital Signs BP 126/80 (BP Location: Right Arm)   Pulse 72   Temp 98.4 F (36.9 C)   Resp 16   Ht 6\' 2"  (1.88 m)   Wt 75 kg   SpO2 99%   BMI 21.23 kg/m  Physical Exam Vitals and nursing note reviewed.  Constitutional:      General: He is not in acute distress.    Appearance: He is well-developed.  HENT:     Head: Normocephalic and atraumatic.  Eyes:     Conjunctiva/sclera: Conjunctivae normal.  Cardiovascular:     Rate and Rhythm: Normal rate and regular rhythm.      Heart sounds: No murmur heard. Pulmonary:     Effort: Pulmonary effort is normal. No respiratory distress.     Breath sounds: Normal breath sounds.  Abdominal:     Palpations: Abdomen is soft.     Tenderness: There is no abdominal tenderness.  Musculoskeletal:        General: Tenderness and signs of injury present. No swelling.     Cervical back: Neck supple.     Comments: Pain with flexion and extension of the right index finger. No TTP along tendon sheath.  Skin:    General: Skin is warm and dry.     Capillary Refill: Capillary refill takes less than 2 seconds.     Comments: Well healing laceration help with sutures along the palmar aspect of the right index finger. Scant drainage present. Minimal erythema.  Neurological:     Mental Status: He is alert.  Psychiatric:        Mood and Affect: Mood normal.     ED Results / Procedures / Treatments   Labs (all labs ordered are listed, but only abnormal results are displayed) Labs Reviewed - No data to display  EKG None  Radiology No results found.  Procedures Procedures  Medications Ordered in ED Medications - No data to display  ED Course/ Medical Decision Making/ A&P                                 Medical Decision Making  This patient presents to the ED for concern of wound check.  Differential diagnosis includes cellulitis, flexor tenosynovitis, septic joint   Problem List / ED Course:  Patient presents emergency department concerns of a wound check.  Had sutures placed for a laceration to the right index finger about 5 days ago.  States that the area has been healing well but has had some worsening pain in the finger with some clear drainage present.  Also states that he is having some difficulty with flexing and extending the finger.  Has not been seen by hand surgery. On evaluation, I suspect that he may have a mild cellulitis developing due to erythema overlying the repaired site. No clear evidence of an  abscess formation. No pain to palpation along the tendon sheath but no pain with flexion and extension. Given these findings, concerned for possible infection and will start on keflex as there is no clear sign of purulent drainage. Also provided patient with contact information for hand surgeon for him to seek evaluation. Discussed return precautions. Discharged home in stable condition.   Final Clinical Impression(s) / ED Diagnoses Final diagnoses:  Visit for wound check  Cellulitis of finger of right hand    Rx / DC Orders ED Discharge Orders          Ordered    cephALEXin (KEFLEX) 500 MG capsule  4 times daily        04/23/23 1351    oxyCODONE-acetaminophen (PERCOCET/ROXICET) 5-325 MG tablet  Every 6 hours PRN        04/23/23 1351              Smitty Knudsen, PA-C 04/23/23 1415    Derwood Kaplan, MD 04/24/23 [phone]

## 2023-04-23 NOTE — ED Triage Notes (Signed)
 Pt arrived via POV from urgent care for evaluation of right finger injury. Pt reports numbness, and shooting pain in his index finger.
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
